# Patient Record
Sex: Male | Born: 1965 | Race: White | Hispanic: No | Marital: Single | Smoking: Former smoker
Health system: Southern US, Community
[De-identification: ages and names within clinical notes are randomized; demographics above are authoritative.]

## PROBLEM LIST (undated history)

## (undated) DIAGNOSIS — I10 Essential (primary) hypertension: Secondary | ICD-10-CM

## (undated) DIAGNOSIS — E039 Hypothyroidism, unspecified: Secondary | ICD-10-CM

## (undated) HISTORY — PX: ESOPHAGOGASTRODUODENOSCOPY: SHX1529

---

## 2011-12-13 ENCOUNTER — Ambulatory Visit: Payer: Self-pay | Admitting: Internal Medicine

## 2011-12-31 ENCOUNTER — Ambulatory Visit: Payer: Self-pay | Admitting: Gastroenterology

## 2015-02-10 ENCOUNTER — Encounter (INDEPENDENT_AMBULATORY_CARE_PROVIDER_SITE_OTHER): Payer: Self-pay | Admitting: *Deleted

## 2015-02-20 ENCOUNTER — Encounter (INDEPENDENT_AMBULATORY_CARE_PROVIDER_SITE_OTHER): Payer: Self-pay | Admitting: *Deleted

## 2015-02-20 ENCOUNTER — Other Ambulatory Visit (INDEPENDENT_AMBULATORY_CARE_PROVIDER_SITE_OTHER): Payer: Self-pay | Admitting: *Deleted

## 2015-02-20 DIAGNOSIS — Z1211 Encounter for screening for malignant neoplasm of colon: Secondary | ICD-10-CM

## 2015-03-24 ENCOUNTER — Telehealth (INDEPENDENT_AMBULATORY_CARE_PROVIDER_SITE_OTHER): Payer: Self-pay | Admitting: *Deleted

## 2015-03-24 DIAGNOSIS — Z1211 Encounter for screening for malignant neoplasm of colon: Secondary | ICD-10-CM

## 2015-03-24 NOTE — Telephone Encounter (Signed)
Patient needs trilyte 

## 2015-03-31 MED ORDER — PEG 3350-KCL-NA BICARB-NACL 420 G PO SOLR: 4000.0000 mL | Freq: Once | ORAL | Status: DC

## 2015-04-14 ENCOUNTER — Telehealth (INDEPENDENT_AMBULATORY_CARE_PROVIDER_SITE_OTHER): Payer: Self-pay | Admitting: *Deleted

## 2015-04-14 NOTE — Telephone Encounter (Signed)
Referring MD/PCP: dondiego   Procedure: tcs  Reason/Indication:  screening  Has patient had this procedure before?  no  If so, when, by whom and where?    Is there a family history of colon cancer?  no  Who?  What age when diagnosed?    Is patient diabetic?   no      Does patient have prosthetic heart valve or mechanical valve?  no  Do you have a pacemaker?  no  Has patient ever had endocarditis? no  Has patient had joint replacement within last 12 months?  no  Does patient tend to be constipated or take laxatives? no  Does patient have a history of alcohol/drug use?  no  Is patient on Coumadin, Plavix and/or Aspirin? no  Medications: synthroid 125 mcg daily, amlodipine 5 mg daily, lactulose 3 times a week, multi vit daily  Allergies: nkda  Medication Adjustment:   Procedure date & time: 05/07/15 at 930

## 2015-04-15 NOTE — Telephone Encounter (Signed)
agree

## 2015-05-07 ENCOUNTER — Encounter (HOSPITAL_COMMUNITY): Payer: Self-pay | Admitting: *Deleted

## 2015-05-07 ENCOUNTER — Encounter (HOSPITAL_COMMUNITY)
Admission: RE | Disposition: A | Discharge status: Home / Self Care | Payer: Self-pay | Source: Ambulatory Visit | Attending: Internal Medicine

## 2015-05-07 ENCOUNTER — Ambulatory Visit (HOSPITAL_COMMUNITY)
Admission: RE | Admit: 2015-05-07 | Discharge: 2015-05-07 | Disposition: A | Discharge status: Home / Self Care | Source: Ambulatory Visit | Attending: Internal Medicine | Admitting: Internal Medicine

## 2015-05-07 DIAGNOSIS — Z79899 Other long term (current) drug therapy: Secondary | ICD-10-CM | POA: Diagnosis not present

## 2015-05-07 DIAGNOSIS — I10 Essential (primary) hypertension: Secondary | ICD-10-CM | POA: Diagnosis not present

## 2015-05-07 DIAGNOSIS — K648 Other hemorrhoids: Secondary | ICD-10-CM

## 2015-05-07 DIAGNOSIS — Q438 Other specified congenital malformations of intestine: Secondary | ICD-10-CM | POA: Diagnosis not present

## 2015-05-07 DIAGNOSIS — E039 Hypothyroidism, unspecified: Secondary | ICD-10-CM | POA: Insufficient documentation

## 2015-05-07 DIAGNOSIS — Z1211 Encounter for screening for malignant neoplasm of colon: Secondary | ICD-10-CM

## 2015-05-07 DIAGNOSIS — F17200 Nicotine dependence, unspecified, uncomplicated: Secondary | ICD-10-CM | POA: Insufficient documentation

## 2015-05-07 DIAGNOSIS — K5730 Diverticulosis of large intestine without perforation or abscess without bleeding: Secondary | ICD-10-CM | POA: Insufficient documentation

## 2015-05-07 HISTORY — PX: COLONOSCOPY: SHX5424

## 2015-05-07 HISTORY — DX: Hypothyroidism, unspecified: E03.9

## 2015-05-07 HISTORY — DX: Essential (primary) hypertension: I10

## 2015-05-07 SURGERY — COLONOSCOPY
Anesthesia: Moderate Sedation

## 2015-05-07 MED ORDER — SODIUM CHLORIDE 0.9 % IV SOLN
INTRAVENOUS | Status: DC
  Administered 2015-05-07: 09:00:00 via INTRAVENOUS

## 2015-05-07 MED ORDER — MIDAZOLAM HCL 5 MG/5ML IJ SOLN
INTRAMUSCULAR | Status: AC
  Filled 2015-05-07: qty 10

## 2015-05-07 MED ORDER — STERILE WATER FOR IRRIGATION IR SOLN
Status: DC | PRN
  Administered 2015-05-07: 09:00:00

## 2015-05-07 MED ORDER — MIDAZOLAM HCL 5 MG/5ML IJ SOLN
INTRAMUSCULAR | Status: DC | PRN
  Administered 2015-05-07 (×7): 2 mg via INTRAVENOUS

## 2015-05-07 MED ORDER — MEPERIDINE HCL 50 MG/ML IJ SOLN
INTRAMUSCULAR | Status: DC | PRN
  Administered 2015-05-07 (×2): 25 mg via INTRAVENOUS

## 2015-05-07 MED ORDER — MEPERIDINE HCL 50 MG/ML IJ SOLN
INTRAMUSCULAR | Status: AC
  Filled 2015-05-07: qty 1

## 2015-05-07 MED ORDER — MIDAZOLAM HCL 5 MG/5ML IJ SOLN
INTRAMUSCULAR | Status: DC
Start: 2015-05-07 — End: 2015-05-07
  Filled 2015-05-07: qty 5

## 2015-05-07 NOTE — H&P (Signed)
Corey GermanyDavid Fernandez is an 49 y.o. male.   Chief Complaint: Patient is here for colonoscopy.Marland Kitchen. HPI: Patient is 49 year old Caucasian male who is here for screening colonoscopy. He denies abdominal pain change in bowel habits or rectal bleeding. Family history is negative for CRC.  Past Medical History  Diagnosis Date  . Hypertension   . Hypothyroidism       history of alcoholic liver disease for which he was [facility] at [facility] in 2012. No further details available. He is still on lactulose. He did undergo EGD and no varices were found.  Past Surgical History  Procedure Laterality Date  . Esophagogastroduodenoscopy      History reviewed. No pertinent family history. Social History:  reports that he has quit smoking. His smoking use included Cigarettes. He has a 20 pack-year smoking history. He does not have any smokeless tobacco history on file. He reports that he does not drink alcohol or use illicit drugs.  Allergies: No Known Allergies  Medications Prior to Admission  Medication Sig Dispense Refill  . amLODipine (NORVASC) 5 MG tablet Take 5 mg by mouth daily.    Marland Kitchen. lactulose (CHRONULAC) 10 GM/15ML solution Take 10 g by mouth daily.    Marland Kitchen. levothyroxine (SYNTHROID, LEVOTHROID) 125 MCG tablet Take 125 mcg by mouth daily before breakfast.    . Multiple Vitamins-Minerals (MULTIVITAMINS THER. W/MINERALS) TABS tablet Take 1 tablet by mouth daily.    . polyethylene glycol-electrolytes (NULYTELY/GOLYTELY) 420 G solution Take 4,000 mLs by mouth once. 4000 mL 0    No results found for this or any previous visit (from the past 48 hour(s)). No results found.  ROS  Blood pressure 123/81, pulse 64, temperature 98.3 F (36.8 C), temperature source Oral, resp. rate 14, height 5\' 9"  (1.753 m), weight 164 lb (74.39 kg), SpO2 100 %. Physical Exam  Constitutional: He appears well-developed and well-nourished.  HENT:  Mouth/Throat: Oropharynx is clear and moist.  Eyes: Conjunctivae are normal. No  scleral icterus.  Neck: No thyromegaly present.  Cardiovascular: Normal rate, regular rhythm and normal heart sounds.   No murmur heard. Respiratory: Effort normal and breath sounds normal.  GI: Soft. He exhibits no distension and no mass. There is no tenderness.  Musculoskeletal: He exhibits no edema.  Lymphadenopathy:    He has no cervical adenopathy.  Neurological: He is alert.  Skin: Skin is warm and dry.     Assessment/Plan Average risk screening colonoscopy.   Corey Fernandez 05/07/2015, 9:06 AM

## 2015-05-07 NOTE — Discharge Instructions (Signed)
Resume usual medications and diet. ° No driving for 24 hours. ° Next screening exam in 10 years. ° °Colonoscopy, Care After °Refer to this sheet in the next few weeks. These instructions provide you with information on caring for yourself after your procedure. Your health care provider may also give you more specific instructions. Your treatment has been planned according to current medical practices, but problems sometimes occur. Call your health care provider if you have any problems or questions after your procedure. °WHAT TO EXPECT AFTER THE PROCEDURE  °After your procedure, it is typical to have the following: °· A small amount of blood in your stool. °· Moderate amounts of gas and mild abdominal cramping or bloating. °HOME CARE INSTRUCTIONS °· Do not drive, operate machinery, or sign important documents for 24 hours. °· You may shower and resume your regular physical activities, but move at a slower pace for the first 24 hours. °· Take frequent rest periods for the first 24 hours. °· Walk around or put a warm pack on your abdomen to help reduce abdominal cramping and bloating. °· Drink enough fluids to keep your urine clear or pale yellow. °· You may resume your normal diet as instructed by your health care provider. Avoid heavy or fried foods that are hard to digest. °· Avoid drinking alcohol for 24 hours or as instructed by your health care provider. °· Only take over-the-counter or prescription medicines as directed by your health care provider. °· If a tissue sample (biopsy) was taken during your procedure: °¨ Do not take aspirin or blood thinners for 7 days, or as instructed by your health care provider. °¨ Do not drink alcohol for 7 days, or as instructed by your health care provider. °¨ Eat soft foods for the first 24 hours. °SEEK MEDICAL CARE IF: °You have persistent spotting of blood in your stool 2-3 days after the procedure. °SEEK IMMEDIATE MEDICAL CARE IF: °· You have more than a small spotting of  blood in your stool. °· You pass large blood clots in your stool. °· Your abdomen is swollen (distended). °· You have nausea or vomiting. °· You have a fever. °· You have increasing abdominal pain that is not relieved with medicine. °  °This information is not intended to replace advice given to you by your health care provider. Make sure you discuss any questions you have with your health care provider. °  °Document Released: 12/23/2003 Document Revised: 02/28/2013 Document Reviewed: 01/15/2013 °Elsevier Interactive Patient Education ©2016 [employer] ° °

## 2015-05-07 NOTE — Op Note (Signed)
COLONOSCOPY PROCEDURE REPORT  PATIENT:  Corey Fernandez  MR#:  [id number] Birthdate:  30-Jan-1966, [age] y.o., male Endoscopist:  Dr. Malissa HippoNajeeb U. Nyzaiah Kai, MD Referred By:  Dr. Dorie Rankichard M Don Diego, MD  Procedure Date: 05/07/2015  Procedure:   Colonoscopy  Indications:  Patient is 49 year old Caucasian male who is undergoing average risk screening colonoscopy.  Informed Consent:  The procedure and risks were reviewed with the patient and informed consent was obtained.  Medications:  Demerol 50 mg IV Versed 14 mg IV  Description of procedure:  After a digital rectal exam was performed, that colonoscope was advanced from the anus through the rectum and colon to the area of the cecum, ileocecal valve and appendiceal orifice. The cecum was deeply intubated. These structures were well-seen and photographed for the record. From the level of the cecum and ileocecal valve, the scope was slowly and cautiously withdrawn. The mucosal surfaces were carefully surveyed utilizing scope tip to flexion to facilitate fold flattening as needed. The scope was pulled down into the rectum where a thorough exam including retroflexion was performed. Scope was changed during the procedure because of malfunction.  Findings:  Prep excellent. Redundant colon with normal mucosa throughout. Single small diverticulum at sigmoid colon. Normal rectal mucosa. Hemorrhoids noted above and below the dentate line.   Therapeutic/Diagnostic Maneuvers Performed:   None  Complications:  None  EBL: None  Cecal Withdrawal Time:  6 minutes  Impression:  Examination performed to cecum. Redundant colon with single diverticulum at sigmoid colon. Internal and external hemorrhoids. No evidence of colonic polyps.  Recommendations:  Standard instructions given. Next screening exam in 10 years.  [name] U  05/07/2015 10:07 AM  CC: Dr. Isabella StallingNDIEGO,[name] M, MD & Dr. Bonnetta BarryNo ref. provider found

## 2015-05-09 ENCOUNTER — Encounter (HOSPITAL_COMMUNITY): Payer: Self-pay | Admitting: Internal Medicine

## 2019-08-17 ENCOUNTER — Emergency Department (HOSPITAL_COMMUNITY)
Admission: EM | Admit: 2019-08-17 | Discharge: 2019-08-17 | Disposition: A | Discharge status: Home / Self Care | Attending: Emergency Medicine | Admitting: Emergency Medicine

## 2019-08-17 ENCOUNTER — Other Ambulatory Visit: Payer: Self-pay

## 2019-08-17 ENCOUNTER — Emergency Department (HOSPITAL_COMMUNITY)

## 2019-08-17 DIAGNOSIS — Z87891 Personal history of nicotine dependence: Secondary | ICD-10-CM | POA: Diagnosis not present

## 2019-08-17 DIAGNOSIS — Z79899 Other long term (current) drug therapy: Secondary | ICD-10-CM | POA: Diagnosis not present

## 2019-08-17 DIAGNOSIS — E039 Hypothyroidism, unspecified: Secondary | ICD-10-CM | POA: Diagnosis not present

## 2019-08-17 DIAGNOSIS — R000 Tachycardia, unspecified: Secondary | ICD-10-CM | POA: Insufficient documentation

## 2019-08-17 LAB — CBC
HCT: 34.4 % — ABNORMAL LOW (ref 39.0–52.0)
Hemoglobin: 12 g/dL — ABNORMAL LOW (ref 13.0–17.0)
MCH: 34.3 pg — ABNORMAL HIGH (ref 26.0–34.0)
MCHC: 34.9 g/dL (ref 30.0–36.0)
MCV: 98.3 fL (ref 80.0–100.0)
Platelets: 113 10*3/uL — ABNORMAL LOW (ref 150–400)
RBC: 3.5 MIL/uL — ABNORMAL LOW (ref 4.22–5.81)
RDW: 13 % (ref 11.5–15.5)
WBC: 5.4 10*3/uL (ref 4.0–10.5)
nRBC: 0 % (ref 0.0–0.2)

## 2019-08-17 LAB — BASIC METABOLIC PANEL
Anion gap: 18 — ABNORMAL HIGH (ref 5–15)
BUN: <5 mg/dL — ABNORMAL LOW (ref 6–20)
CO2: 23 mmol/L (ref 22–32)
Calcium: 9.2 mg/dL (ref 8.9–10.3)
Chloride: 92 mmol/L — ABNORMAL LOW (ref 98–111)
Creatinine, Ser: 0.82 mg/dL (ref 0.61–1.24)
GFR calc Af Amer: >60 mL/min (ref >60)
GFR calc non Af Amer: >60 mL/min (ref >60)
Glucose, Bld: 109 mg/dL — ABNORMAL HIGH (ref 70–99)
Potassium: 3.3 mmol/L — ABNORMAL LOW (ref 3.5–5.1)
Sodium: 133 mmol/L — ABNORMAL LOW (ref 135–145)

## 2019-08-17 LAB — D-DIMER, QUANTITATIVE (NOT AT ARMC): D-Dimer, Quant: <0.27 ug/mL-FEU (ref 0.00–0.50)

## 2019-08-17 MED ORDER — IOHEXOL 350 MG/ML SOLN
80.0000 mL | Freq: Once | INTRAVENOUS | Status: AC | PRN
  Administered 2019-08-17: 80 mL via INTRAVENOUS

## 2019-08-17 MED ORDER — SODIUM CHLORIDE 0.9% FLUSH
3.0000 mL | Freq: Once | INTRAVENOUS | Status: AC
  Administered 2019-08-17: 3 mL via INTRAVENOUS

## 2019-08-17 NOTE — ED Provider Notes (Signed)
[place] EMERGENCY DEPARTMENT Provider Note   CSN: [id number] Arrival date & time: 08/17/19  1024     History Chief Complaint  Patient presents with  . Tachycardia    Corey Fernandez is a 54 y.o. male history of alcoholic cirrhosis of the liver, presenting to emergency department with report of tachycardia, palpitations, positive D-dimer test.  Patient reports has been noting some palpitations for the past few weeks.  He went to [place] (?)  Where he had blood work done including a D-dimer test.  He reports he was called today and told his D-dimer test was positive he should go to the emergency department as there is a concern for possible blood clot.  He has never had a blood clot before.  He denies any pain or swelling in either calf or lower extremity.  Denies any chest pain or shortness of breath.  His heart rate typically runs in the 80s, but he has noticed it is been over 100 recently.  No lightheadedness.  He does not drink alcohol currently.   HPI     Past Medical History:  Diagnosis Date  . Hypertension   . Hypothyroidism     There are no problems to display for this patient.   Past Surgical History:  Procedure Laterality Date  . COLONOSCOPY N/A 05/07/2015   Procedure: COLONOSCOPY;  Surgeon: Rogene Houston, MD;  Location: [facility];  Service: Endoscopy;  Laterality: N/A;  925  . ESOPHAGOGASTRODUODENOSCOPY         No family history on file.  Social History   Tobacco Use  . Smoking status: Former Smoker    Packs/day: 1.00    Years: 20.00    Pack years: 20.00    Types: Cigarettes  Substance Use Topics  . Alcohol use: No  . Drug use: No    Home Medications Prior to Admission medications   Medication Sig Start Date End Date Taking? Authorizing Provider  amLODipine (NORVASC) 5 MG tablet Take 5 mg by mouth daily.    [provider]  lactulose (CHRONULAC) 10 GM/15ML solution Take 10 g by mouth daily.     [provider]  levothyroxine (SYNTHROID, LEVOTHROID) 125 MCG tablet Take 125 mcg by mouth daily before breakfast.    [provider]  Multiple Vitamins-Minerals (MULTIVITAMINS THER. W/MINERALS) TABS tablet Take 1 tablet by mouth daily.    [provider]    Allergies    Patient has no known allergies.  Review of Systems   Review of Systems  Constitutional: Negative for chills and fever.  Eyes: Negative for photophobia and visual disturbance.  Respiratory: Negative for cough and shortness of breath.   Cardiovascular: Positive for palpitations. Negative for chest pain and leg swelling.  Gastrointestinal: Negative for abdominal pain, nausea and vomiting.  Genitourinary: Negative for dysuria and hematuria.  Musculoskeletal: Negative for arthralgias and back pain.  Skin: Negative for color change and rash.  Neurological: Negative for syncope, light-headedness and headaches.  Psychiatric/Behavioral: Negative for agitation and confusion.  All other systems reviewed and are negative.   Physical Exam Updated Vital Signs BP 121/86   Pulse (!) 110   Temp 99.5 F (37.5 C) (Oral)   Resp 18   Ht 5\' 9"  (1.753 m)   Wt 59 kg   SpO2 97%   BMI 19.20 kg/m   Physical Exam Vitals and nursing note reviewed.  Constitutional:      Appearance: He is well-developed.  HENT:  Head: Normocephalic and atraumatic.  Eyes:     Conjunctiva/sclera: Conjunctivae normal.  Cardiovascular:     Rate and Rhythm: Regular rhythm. Tachycardia present.     Heart sounds: No murmur.     Comments: Hr 100-110 bpm Pulmonary:     Effort: Pulmonary effort is normal. No respiratory distress.     Breath sounds: Normal breath sounds.  Abdominal:     Palpations: Abdomen is soft.     Tenderness: There is no abdominal tenderness.  Musculoskeletal:        General: No swelling or tenderness.     Cervical back: Neck supple.     Right lower leg: No edema.     Left lower leg: No edema.    Skin:    General: Skin is warm and dry.  Neurological:     General: No focal deficit present.     Mental Status: He is alert and oriented to person, place, and time.  Psychiatric:        Mood and Affect: Mood normal.        Behavior: Behavior normal.     ED Results / Procedures / Treatments   Labs (all labs ordered are listed, but only abnormal results are displayed) Labs Reviewed  BASIC METABOLIC PANEL - Abnormal; Notable for the following components:      Result Value   Sodium 133 (*)    Potassium 3.3 (*)    Chloride 92 (*)    Glucose, Bld 109 (*)    BUN <5 (*)    Anion gap 18 (*)    All other components within normal limits  CBC - Abnormal; Notable for the following components:   RBC 3.50 (*)    Hemoglobin 12.0 (*)    HCT 34.4 (*)    MCH 34.3 (*)    Platelets 113 (*)    All other components within normal limits  D-DIMER, QUANTITATIVE (NOT AT [facility])    EKG EKG Interpretation  Date/Time:  Friday August 17 2019 10:24:22 EDT Ventricular Rate:  116 PR Interval:  112 QRS Duration: 70 QT Interval:  346 QTC Calculation: 480 R Axis:   -8 Text Interpretation: Sinus tachycardia with Premature ventricular complexes or Fusion complexes Otherwise normal ECG No STEMI Confirmed by Alvester Chou [phone]) on 08/17/2019 11:40:43 AM   Radiology DG Chest 2 View  Result Date: 08/17/2019 CLINICAL DATA:  Tachycardia, positive D dimer, history of cirrhosis EXAM: CHEST - 2 VIEW COMPARISON:  None. FINDINGS: The heart size and mediastinal contours are within normal limits. Both lungs are clear. The visualized skeletal structures are unremarkable. IMPRESSION: No active cardiopulmonary disease. Electronically Signed   By: Sharlet Salina M.D.   On: 08/17/2019 10:46   CT Angio Chest PE W and/or Wo Contrast  Result Date: 08/17/2019 CLINICAL DATA:  Tachycardia, positive D-dimer. Shortness of breath. EXAM: CT ANGIOGRAPHY CHEST WITH CONTRAST TECHNIQUE: Multidetector CT imaging of the chest was  performed using the standard protocol during bolus administration of intravenous contrast. Multiplanar CT image reconstructions and MIPs were obtained to evaluate the vascular anatomy. CONTRAST:  38mL OMNIPAQUE IOHEXOL 350 MG/ML SOLN COMPARISON:  Chest x-ray March 11/11/2019 FINDINGS: Cardiovascular: Satisfactory opacification of the pulmonary arteries to the segmental level. No evidence of pulmonary embolism. Normal heart size. No pericardial effusion. Mediastinum/Nodes: No enlarged mediastinal, hilar, or axillary lymph nodes. Thyroid gland, trachea, and esophagus demonstrate no significant findings. Lungs/Pleura: Lungs are clear. No pleural effusion or pneumothorax. Upper Abdomen: Diffuse low density of liver is identified without focal  liver lesion. The other visualized upper abdominal structures are normal. Musculoskeletal: Mild degenerative joint changes of the spine are noted. Review of the MIP images confirms the above findings. IMPRESSION: 1. No pulmonary embolus. No acute abnormality identified in the chest. 2. Fatty infiltration of liver. Electronically Signed   By: Sherian Rein M.D.   On: 08/17/2019 13:13    Procedures Procedures (including critical care time)  Medications Ordered in ED Medications  sodium chloride flush (NS) 0.9 % injection 3 mL (3 mLs Intravenous Given 08/17/19 1112)  iohexol (OMNIPAQUE) 350 MG/ML injection 80 mL (80 mLs Intravenous Contrast Given 08/17/19 1301)    ED Course  I have reviewed the triage vital signs and the nursing notes.  Pertinent labs & imaging results that were available during my care of the patient were reviewed by me and considered in my medical decision making (see chart for details).  54 year old male presented emerge department positive outpatient D-dimer test.  Reporting tachycardia and palpitations with past several days or even weeks.  No lightheadedness shortness of breath.  His vital signs are stable here aside from mild tachycardia.  No  hypotension or significant hypoxia.  We'll check hgb level, creatinine, ddimer here to confirm (we don't have his results in our system).  If positive we can proceed with CT PE.  Doubtful of ACS.  Does not appear to be a fib on ecg.  Clinical Course as of Aug 16 1909  Fri Aug 17, 2019  1214 Ddimer test negative.  On re-examination patient continues to insist his palpitations and tachycardia is new.  My suspicion remains high enough to warrant a CT scan.   [MT]    Clinical Course User Index [MT] Grettell Ransdell, Kermit Balo, MD   Final Clinical Impression(s) / ED Diagnoses Final diagnoses:  Tachycardia    Rx / DC Orders ED Discharge Orders    None       Terald Sleeper, MD 08/17/19 1911

## 2019-08-17 NOTE — Discharge Instructions (Addendum)
There was no blood clot seen in your lungs on the CT scan of your lungs.  Your Ddimer blood test was NEGATIVE in the ER today.

## 2019-08-17 NOTE — ED Triage Notes (Signed)
C/o tachycardia and reported tested + d. Dimer, Patient endorsed recent hospitalization d/t alcohol cirrhosis.

## 2019-10-14 ENCOUNTER — Emergency Department (HOSPITAL_COMMUNITY)

## 2019-10-14 ENCOUNTER — Inpatient Hospital Stay (HOSPITAL_COMMUNITY)
Admission: EM | Admit: 2019-10-14 | Discharge: 2019-10-19 | DRG: 432 | Disposition: A | Discharge status: Hospice | Attending: Family Medicine | Admitting: Family Medicine

## 2019-10-14 ENCOUNTER — Other Ambulatory Visit: Payer: Self-pay

## 2019-10-14 DIAGNOSIS — E878 Other disorders of electrolyte and fluid balance, not elsewhere classified: Secondary | ICD-10-CM | POA: Diagnosis present

## 2019-10-14 DIAGNOSIS — K7460 Unspecified cirrhosis of liver: Secondary | ICD-10-CM

## 2019-10-14 DIAGNOSIS — Z66 Do not resuscitate: Secondary | ICD-10-CM | POA: Diagnosis present

## 2019-10-14 DIAGNOSIS — N179 Acute kidney failure, unspecified: Secondary | ICD-10-CM

## 2019-10-14 DIAGNOSIS — K8000 Calculus of gallbladder with acute cholecystitis without obstruction: Secondary | ICD-10-CM | POA: Diagnosis not present

## 2019-10-14 DIAGNOSIS — E43 Unspecified severe protein-calorie malnutrition: Secondary | ICD-10-CM | POA: Diagnosis not present

## 2019-10-14 DIAGNOSIS — Z87891 Personal history of nicotine dependence: Secondary | ICD-10-CM

## 2019-10-14 DIAGNOSIS — Z20822 Contact with and (suspected) exposure to covid-19: Secondary | ICD-10-CM | POA: Diagnosis not present

## 2019-10-14 DIAGNOSIS — Z515 Encounter for palliative care: Secondary | ICD-10-CM | POA: Diagnosis not present

## 2019-10-14 DIAGNOSIS — D696 Thrombocytopenia, unspecified: Secondary | ICD-10-CM | POA: Diagnosis present

## 2019-10-14 DIAGNOSIS — K7011 Alcoholic hepatitis with ascites: Secondary | ICD-10-CM | POA: Diagnosis present

## 2019-10-14 DIAGNOSIS — Z0189 Encounter for other specified special examinations: Secondary | ICD-10-CM

## 2019-10-14 DIAGNOSIS — Z79890 Hormone replacement therapy (postmenopausal): Secondary | ICD-10-CM

## 2019-10-14 DIAGNOSIS — D689 Coagulation defect, unspecified: Secondary | ICD-10-CM | POA: Diagnosis present

## 2019-10-14 DIAGNOSIS — E039 Hypothyroidism, unspecified: Secondary | ICD-10-CM | POA: Diagnosis present

## 2019-10-14 DIAGNOSIS — E876 Hypokalemia: Secondary | ICD-10-CM

## 2019-10-14 DIAGNOSIS — Z681 Body mass index (BMI) 19 or less, adult: Secondary | ICD-10-CM

## 2019-10-14 DIAGNOSIS — I10 Essential (primary) hypertension: Secondary | ICD-10-CM | POA: Diagnosis present

## 2019-10-14 DIAGNOSIS — K7031 Alcoholic cirrhosis of liver with ascites: Principal | ICD-10-CM | POA: Diagnosis present

## 2019-10-14 DIAGNOSIS — Z79899 Other long term (current) drug therapy: Secondary | ICD-10-CM

## 2019-10-14 DIAGNOSIS — Z9114 Patient's other noncompliance with medication regimen: Secondary | ICD-10-CM | POA: Diagnosis not present

## 2019-10-14 DIAGNOSIS — K7290 Hepatic failure, unspecified without coma: Secondary | ICD-10-CM | POA: Diagnosis present

## 2019-10-14 DIAGNOSIS — F1010 Alcohol abuse, uncomplicated: Secondary | ICD-10-CM | POA: Diagnosis present

## 2019-10-14 DIAGNOSIS — E871 Hypo-osmolality and hyponatremia: Secondary | ICD-10-CM | POA: Diagnosis present

## 2019-10-14 DIAGNOSIS — K7682 Hepatic encephalopathy: Secondary | ICD-10-CM

## 2019-10-14 DIAGNOSIS — K7040 Alcoholic hepatic failure without coma: Secondary | ICD-10-CM | POA: Diagnosis not present

## 2019-10-14 DIAGNOSIS — R531 Weakness: Secondary | ICD-10-CM

## 2019-10-14 DIAGNOSIS — D539 Nutritional anemia, unspecified: Secondary | ICD-10-CM | POA: Diagnosis present

## 2019-10-14 DIAGNOSIS — L8990 Pressure ulcer of unspecified site, unspecified stage: Secondary | ICD-10-CM | POA: Insufficient documentation

## 2019-10-14 DIAGNOSIS — G9341 Metabolic encephalopathy: Secondary | ICD-10-CM | POA: Diagnosis not present

## 2019-10-14 DIAGNOSIS — R17 Unspecified jaundice: Secondary | ICD-10-CM

## 2019-10-14 DIAGNOSIS — E872 Acidosis, unspecified: Secondary | ICD-10-CM

## 2019-10-14 DIAGNOSIS — B1920 Unspecified viral hepatitis C without hepatic coma: Secondary | ICD-10-CM | POA: Diagnosis present

## 2019-10-14 DIAGNOSIS — R5381 Other malaise: Secondary | ICD-10-CM | POA: Diagnosis present

## 2019-10-14 DIAGNOSIS — Z4659 Encounter for fitting and adjustment of other gastrointestinal appliance and device: Secondary | ICD-10-CM

## 2019-10-14 DIAGNOSIS — D509 Iron deficiency anemia, unspecified: Secondary | ICD-10-CM | POA: Diagnosis present

## 2019-10-14 DIAGNOSIS — Z7189 Other specified counseling: Secondary | ICD-10-CM

## 2019-10-14 LAB — CBC
HCT: 25.3 % — ABNORMAL LOW (ref 39.0–52.0)
Hemoglobin: 8.9 g/dL — ABNORMAL LOW (ref 13.0–17.0)
MCH: 36.6 pg — ABNORMAL HIGH (ref 26.0–34.0)
MCHC: 35.2 g/dL (ref 30.0–36.0)
MCV: 104.1 fL — ABNORMAL HIGH (ref 80.0–100.0)
Platelets: 158 10*3/uL (ref 150–400)
RBC: 2.43 MIL/uL — ABNORMAL LOW (ref 4.22–5.81)
RDW: 13.3 % (ref 11.5–15.5)
WBC: 11.6 10*3/uL — ABNORMAL HIGH (ref 4.0–10.5)
nRBC: 0.2 % (ref 0.0–0.2)

## 2019-10-14 LAB — COMPREHENSIVE METABOLIC PANEL
ALT: 76 U/L — ABNORMAL HIGH (ref 0–44)
AST: 102 U/L — ABNORMAL HIGH (ref 15–41)
Albumin: 1.8 g/dL — ABNORMAL LOW (ref 3.5–5.0)
Alkaline Phosphatase: 201 U/L — ABNORMAL HIGH (ref 38–126)
Anion gap: 21 — ABNORMAL HIGH (ref 5–15)
BUN: 8 mg/dL (ref 6–20)
CO2: 40 mmol/L — ABNORMAL HIGH (ref 22–32)
Calcium: 8.1 mg/dL — ABNORMAL LOW (ref 8.9–10.3)
Chloride: 69 mmol/L — ABNORMAL LOW (ref 98–111)
Creatinine, Ser: 1.62 mg/dL — ABNORMAL HIGH (ref 0.61–1.24)
GFR calc Af Amer: 55 mL/min — ABNORMAL LOW (ref >60)
GFR calc non Af Amer: 47 mL/min — ABNORMAL LOW (ref >60)
Glucose, Bld: 84 mg/dL (ref 70–99)
Potassium: <2 mmol/L — CL (ref 3.5–5.1)
Sodium: 130 mmol/L — ABNORMAL LOW (ref 135–145)
Total Bilirubin: 22.3 mg/dL (ref 0.3–1.2)
Total Protein: 6 g/dL — ABNORMAL LOW (ref 6.5–8.1)

## 2019-10-14 LAB — LIPASE, BLOOD: Lipase: 66 U/L — ABNORMAL HIGH (ref 11–51)

## 2019-10-14 MED ORDER — POTASSIUM CHLORIDE CRYS ER 20 MEQ PO TBCR
80.0000 meq | EXTENDED_RELEASE_TABLET | Freq: Once | ORAL | Status: AC
  Administered 2019-10-14: 80 meq via ORAL
  Filled 2019-10-14: qty 4

## 2019-10-14 MED ORDER — SODIUM CHLORIDE 0.9% FLUSH: 3.0000 mL | Freq: Once | INTRAVENOUS | Status: DC

## 2019-10-14 MED ORDER — POTASSIUM CHLORIDE 10 MEQ/100ML IV SOLN
10.0000 meq | INTRAVENOUS | Status: AC
  Administered 2019-10-14 – 2019-10-15 (×4): 10 meq via INTRAVENOUS
  Filled 2019-10-14 (×3): qty 100

## 2019-10-14 MED ORDER — MAGNESIUM SULFATE 2 GM/50ML IV SOLN
2.0000 g | Freq: Once | INTRAVENOUS | Status: AC
  Administered 2019-10-14: 2 g via INTRAVENOUS
  Filled 2019-10-14: qty 50

## 2019-10-14 NOTE — ED Triage Notes (Addendum)
Pt has hx of cirrhosis and hepatitis C per pt brother. Pt brother says that he has been unable to get in contact with the patient. Police went to home for welfare check. Pt has jaundiced, distended abdomen, not acting normal. Pt is jaundiced, slow to respond to answers in triage.

## 2019-10-14 NOTE — ED Notes (Signed)
CRITICAL VALUE STICKER  CRITICAL VALUE: K <2.0; Total Bili 22.3  RECEIVER (on-site recipient of call): [place] Ferrari RN  DATE & TIME NOTIFIED: 10/14/19 1105p  MESSENGER (representative from lab): Vince  PROVIDER NOTIFIED: Cyndia Skeeters. PA  TIME OF NOTIFICATION: 1102p  RESPONSE: see orders

## 2019-10-15 ENCOUNTER — Encounter (HOSPITAL_COMMUNITY): Payer: Self-pay | Admitting: Family Medicine

## 2019-10-15 ENCOUNTER — Inpatient Hospital Stay (HOSPITAL_COMMUNITY)

## 2019-10-15 DIAGNOSIS — K7040 Alcoholic hepatic failure without coma: Secondary | ICD-10-CM | POA: Diagnosis present

## 2019-10-15 DIAGNOSIS — E039 Hypothyroidism, unspecified: Secondary | ICD-10-CM | POA: Diagnosis present

## 2019-10-15 DIAGNOSIS — E43 Unspecified severe protein-calorie malnutrition: Secondary | ICD-10-CM | POA: Diagnosis present

## 2019-10-15 DIAGNOSIS — E872 Acidosis: Secondary | ICD-10-CM | POA: Diagnosis present

## 2019-10-15 DIAGNOSIS — Z681 Body mass index (BMI) 19 or less, adult: Secondary | ICD-10-CM | POA: Diagnosis not present

## 2019-10-15 DIAGNOSIS — I10 Essential (primary) hypertension: Secondary | ICD-10-CM | POA: Diagnosis present

## 2019-10-15 DIAGNOSIS — K7031 Alcoholic cirrhosis of liver with ascites: Principal | ICD-10-CM

## 2019-10-15 DIAGNOSIS — K8000 Calculus of gallbladder with acute cholecystitis without obstruction: Secondary | ICD-10-CM | POA: Diagnosis present

## 2019-10-15 DIAGNOSIS — E871 Hypo-osmolality and hyponatremia: Secondary | ICD-10-CM | POA: Diagnosis present

## 2019-10-15 DIAGNOSIS — E876 Hypokalemia: Secondary | ICD-10-CM | POA: Diagnosis present

## 2019-10-15 DIAGNOSIS — G9341 Metabolic encephalopathy: Secondary | ICD-10-CM | POA: Diagnosis present

## 2019-10-15 DIAGNOSIS — B1920 Unspecified viral hepatitis C without hepatic coma: Secondary | ICD-10-CM | POA: Diagnosis present

## 2019-10-15 DIAGNOSIS — K7290 Hepatic failure, unspecified without coma: Secondary | ICD-10-CM | POA: Diagnosis present

## 2019-10-15 DIAGNOSIS — Z66 Do not resuscitate: Secondary | ICD-10-CM | POA: Diagnosis present

## 2019-10-15 DIAGNOSIS — D689 Coagulation defect, unspecified: Secondary | ICD-10-CM

## 2019-10-15 DIAGNOSIS — D539 Nutritional anemia, unspecified: Secondary | ICD-10-CM | POA: Diagnosis present

## 2019-10-15 DIAGNOSIS — N179 Acute kidney failure, unspecified: Secondary | ICD-10-CM | POA: Diagnosis present

## 2019-10-15 DIAGNOSIS — Z9114 Patient's other noncompliance with medication regimen: Secondary | ICD-10-CM | POA: Diagnosis not present

## 2019-10-15 DIAGNOSIS — R17 Unspecified jaundice: Secondary | ICD-10-CM | POA: Diagnosis not present

## 2019-10-15 DIAGNOSIS — Z79890 Hormone replacement therapy (postmenopausal): Secondary | ICD-10-CM | POA: Diagnosis not present

## 2019-10-15 DIAGNOSIS — K7200 Acute and subacute hepatic failure without coma: Secondary | ICD-10-CM | POA: Diagnosis not present

## 2019-10-15 DIAGNOSIS — F1010 Alcohol abuse, uncomplicated: Secondary | ICD-10-CM | POA: Diagnosis present

## 2019-10-15 DIAGNOSIS — D696 Thrombocytopenia, unspecified: Secondary | ICD-10-CM | POA: Diagnosis present

## 2019-10-15 DIAGNOSIS — Z20822 Contact with and (suspected) exposure to covid-19: Secondary | ICD-10-CM | POA: Diagnosis present

## 2019-10-15 DIAGNOSIS — K7682 Hepatic encephalopathy: Secondary | ICD-10-CM | POA: Diagnosis present

## 2019-10-15 DIAGNOSIS — Z7189 Other specified counseling: Secondary | ICD-10-CM | POA: Diagnosis not present

## 2019-10-15 DIAGNOSIS — K7460 Unspecified cirrhosis of liver: Secondary | ICD-10-CM | POA: Diagnosis present

## 2019-10-15 DIAGNOSIS — Z515 Encounter for palliative care: Secondary | ICD-10-CM | POA: Diagnosis not present

## 2019-10-15 DIAGNOSIS — K7011 Alcoholic hepatitis with ascites: Secondary | ICD-10-CM | POA: Diagnosis present

## 2019-10-15 LAB — GLUCOSE, PLEURAL OR PERITONEAL FLUID: Glucose, Fluid: 80 mg/dL

## 2019-10-15 LAB — SALICYLATE LEVEL: Salicylate Lvl: 14.9 mg/dL (ref 7.0–30.0)

## 2019-10-15 LAB — COMPREHENSIVE METABOLIC PANEL
ALT: 65 U/L — ABNORMAL HIGH (ref 0–44)
AST: 270 U/L — ABNORMAL HIGH (ref 15–41)
Albumin: 1.5 g/dL — ABNORMAL LOW (ref 3.5–5.0)
Alkaline Phosphatase: 167 U/L — ABNORMAL HIGH (ref 38–126)
Anion gap: 19 — ABNORMAL HIGH (ref 5–15)
BUN: 7 mg/dL (ref 6–20)
CO2: 36 mmol/L — ABNORMAL HIGH (ref 22–32)
Calcium: 7.2 mg/dL — ABNORMAL LOW (ref 8.9–10.3)
Chloride: 75 mmol/L — ABNORMAL LOW (ref 98–111)
Creatinine, Ser: 1.41 mg/dL — ABNORMAL HIGH (ref 0.61–1.24)
GFR calc Af Amer: >60 mL/min (ref >60)
GFR calc non Af Amer: 56 mL/min — ABNORMAL LOW (ref >60)
Glucose, Bld: 78 mg/dL (ref 70–99)
Potassium: 2.3 mmol/L — CL (ref 3.5–5.1)
Sodium: 130 mmol/L — ABNORMAL LOW (ref 135–145)
Total Bilirubin: 19.4 mg/dL (ref 0.3–1.2)
Total Protein: 4.9 g/dL — ABNORMAL LOW (ref 6.5–8.1)

## 2019-10-15 LAB — CBC WITH DIFFERENTIAL/PLATELET
Abs Immature Granulocytes: 0.1 10*3/uL — ABNORMAL HIGH (ref 0.00–0.07)
Basophils Absolute: 0 10*3/uL (ref 0.0–0.1)
Basophils Relative: 0 %
Eosinophils Absolute: 0 10*3/uL (ref 0.0–0.5)
Eosinophils Relative: 0 %
HCT: 23.6 % — ABNORMAL LOW (ref 39.0–52.0)
Hemoglobin: 8.4 g/dL — ABNORMAL LOW (ref 13.0–17.0)
Immature Granulocytes: 1 %
Lymphocytes Relative: 10 %
Lymphs Abs: 0.9 10*3/uL (ref 0.7–4.0)
MCH: 37.3 pg — ABNORMAL HIGH (ref 26.0–34.0)
MCHC: 35.6 g/dL (ref 30.0–36.0)
MCV: 104.9 fL — ABNORMAL HIGH (ref 80.0–100.0)
Monocytes Absolute: 1.1 10*3/uL — ABNORMAL HIGH (ref 0.1–1.0)
Monocytes Relative: 12 %
Neutro Abs: 7 10*3/uL (ref 1.7–7.7)
Neutrophils Relative %: 77 %
Platelets: 125 10*3/uL — ABNORMAL LOW (ref 150–400)
RBC: 2.25 MIL/uL — ABNORMAL LOW (ref 4.22–5.81)
RDW: 13.6 % (ref 11.5–15.5)
WBC: 9.1 10*3/uL (ref 4.0–10.5)
nRBC: 0 % (ref 0.0–0.2)

## 2019-10-15 LAB — GRAM STAIN

## 2019-10-15 LAB — IRON AND TIBC
Iron: 102 ug/dL (ref 45–182)
Saturation Ratios: 113 % — ABNORMAL HIGH (ref 17.9–39.5)
TIBC: 91 ug/dL — ABNORMAL LOW (ref 250–450)

## 2019-10-15 LAB — PROTEIN, PLEURAL OR PERITONEAL FLUID: Total protein, fluid: <3 g/dL

## 2019-10-15 LAB — POTASSIUM: Potassium: 2.4 mmol/L — CL (ref 3.5–5.1)

## 2019-10-15 LAB — T4, FREE: Free T4: 1.83 ng/dL — ABNORMAL HIGH (ref 0.61–1.12)

## 2019-10-15 LAB — MAGNESIUM
Magnesium: 1.2 mg/dL — ABNORMAL LOW (ref 1.7–2.4)
Magnesium: 1.6 mg/dL — ABNORMAL LOW (ref 1.7–2.4)
Magnesium: 2.8 mg/dL — ABNORMAL HIGH (ref 1.7–2.4)

## 2019-10-15 LAB — BILIRUBIN, DIRECT: Bilirubin, Direct: 10.7 mg/dL — ABNORMAL HIGH (ref 0.0–0.2)

## 2019-10-15 LAB — LACTIC ACID, PLASMA
Lactic Acid, Venous: 5.3 mmol/L (ref 0.5–1.9)
Lactic Acid, Venous: 5.7 mmol/L (ref 0.5–1.9)

## 2019-10-15 LAB — RETICULOCYTES
Immature Retic Fract: 15.9 % (ref 2.3–15.9)
RBC.: 2.17 MIL/uL — ABNORMAL LOW (ref 4.22–5.81)
Retic Count, Absolute: 84.6 10*3/uL (ref 19.0–186.0)
Retic Ct Pct: 3.9 % — ABNORMAL HIGH (ref 0.4–3.1)

## 2019-10-15 LAB — AMYLASE, PLEURAL OR PERITONEAL FLUID: Amylase, Fluid: 32 U/L

## 2019-10-15 LAB — LACTATE DEHYDROGENASE, PLEURAL OR PERITONEAL FLUID: LD, Fluid: 65 U/L — ABNORMAL HIGH (ref 3–23)

## 2019-10-15 LAB — HEPATITIS PANEL, ACUTE
HCV Ab: NONREACTIVE
Hep A IgM: NONREACTIVE
Hep B C IgM: NONREACTIVE
Hepatitis B Surface Ag: NONREACTIVE

## 2019-10-15 LAB — TSH: TSH: 0.071 u[IU]/mL — ABNORMAL LOW (ref 0.350–4.500)

## 2019-10-15 LAB — MRSA PCR SCREENING: MRSA by PCR: NEGATIVE

## 2019-10-15 LAB — VITAMIN B12: Vitamin B-12: 1238 pg/mL — ABNORMAL HIGH (ref 180–914)

## 2019-10-15 LAB — PROTIME-INR
INR: 1.7 — ABNORMAL HIGH (ref 0.8–1.2)
Prothrombin Time: 19.1 seconds — ABNORMAL HIGH (ref 11.4–15.2)

## 2019-10-15 LAB — AMMONIA: Ammonia: 81 umol/L — ABNORMAL HIGH (ref 9–35)

## 2019-10-15 LAB — BODY FLUID CELL COUNT WITH DIFFERENTIAL
Lymphs, Fluid: 2 %
Monocyte-Macrophage-Serous Fluid: 90 % (ref 50–90)
Neutrophil Count, Fluid: 8 % (ref 0–25)
Total Nucleated Cell Count, Fluid: 28 cu mm (ref 0–1000)

## 2019-10-15 LAB — FERRITIN: Ferritin: 2394 ng/mL — ABNORMAL HIGH (ref 24–336)

## 2019-10-15 LAB — SARS CORONAVIRUS 2 BY RT PCR (HOSPITAL ORDER, PERFORMED IN ~~LOC~~ HOSPITAL LAB): SARS Coronavirus 2: NEGATIVE

## 2019-10-15 LAB — ACETAMINOPHEN LEVEL: Acetaminophen (Tylenol), Serum: <10 ug/mL — ABNORMAL LOW (ref 10–30)

## 2019-10-15 LAB — ALBUMIN, PLEURAL OR PERITONEAL FLUID: Albumin, Fluid: <1 g/dL

## 2019-10-15 LAB — PHOSPHORUS: Phosphorus: 3 mg/dL (ref 2.5–4.6)

## 2019-10-15 LAB — HIV ANTIBODY (ROUTINE TESTING W REFLEX): HIV Screen 4th Generation wRfx: NONREACTIVE

## 2019-10-15 LAB — FOLATE: Folate: 18 ng/mL (ref >5.9)

## 2019-10-15 LAB — APTT: aPTT: 41 seconds — ABNORMAL HIGH (ref 24–36)

## 2019-10-15 MED ORDER — SODIUM CHLORIDE 0.9% FLUSH
3.0000 mL | Freq: Two times a day (BID) | INTRAVENOUS | Status: DC
  Administered 2019-10-15 – 2019-10-18 (×8): 3 mL via INTRAVENOUS

## 2019-10-15 MED ORDER — ADULT MULTIVITAMIN W/MINERALS CH
1.0000 | ORAL_TABLET | Freq: Every day | ORAL | Status: DC
  Administered 2019-10-15: 1 via ORAL
  Filled 2019-10-15: qty 1

## 2019-10-15 MED ORDER — LORAZEPAM 2 MG/ML IJ SOLN: 0.0000 mg | Freq: Three times a day (TID) | INTRAMUSCULAR | Status: AC

## 2019-10-15 MED ORDER — LIDOCAINE HCL 1 % IJ SOLN
INTRAMUSCULAR | Status: AC
  Filled 2019-10-15: qty 20

## 2019-10-15 MED ORDER — POTASSIUM CHLORIDE IN NACL 20-0.9 MEQ/L-% IV SOLN
INTRAVENOUS | Status: AC
  Filled 2019-10-15: qty 1000

## 2019-10-15 MED ORDER — THIAMINE HCL 100 MG PO TABS
100.0000 mg | ORAL_TABLET | Freq: Every day | ORAL | Status: DC
  Administered 2019-10-15: 100 mg via ORAL
  Filled 2019-10-15: qty 1

## 2019-10-15 MED ORDER — ONDANSETRON HCL 4 MG PO TABS: 4.0000 mg | ORAL_TABLET | Freq: Four times a day (QID) | ORAL | Status: DC | PRN

## 2019-10-15 MED ORDER — ALBUMIN HUMAN 25 % IV SOLN
50.0000 g | Freq: Once | INTRAVENOUS | Status: DC | PRN
  Filled 2019-10-15: qty 200

## 2019-10-15 MED ORDER — ONDANSETRON HCL 4 MG/2ML IJ SOLN: 4.0000 mg | Freq: Four times a day (QID) | INTRAMUSCULAR | Status: DC | PRN

## 2019-10-15 MED ORDER — MAGNESIUM SULFATE 50 % IJ SOLN: 4.0000 g | Freq: Once | INTRAVENOUS | Status: DC

## 2019-10-15 MED ORDER — LEVOTHYROXINE SODIUM 100 MCG PO TABS: 100.0000 ug | ORAL_TABLET | Freq: Every day | ORAL | Status: DC

## 2019-10-15 MED ORDER — LORAZEPAM 2 MG/ML IJ SOLN
0.0000 mg | INTRAMUSCULAR | Status: AC
  Administered 2019-10-15: 2 mg via INTRAVENOUS
  Filled 2019-10-15: qty 1

## 2019-10-15 MED ORDER — POTASSIUM CHLORIDE 10 MEQ/100ML IV SOLN
10.0000 meq | INTRAVENOUS | Status: AC
  Administered 2019-10-15 – 2019-10-16 (×5): 10 meq via INTRAVENOUS
  Filled 2019-10-15 (×5): qty 100

## 2019-10-15 MED ORDER — CHLORHEXIDINE GLUCONATE CLOTH 2 % EX PADS
6.0000 | MEDICATED_PAD | Freq: Every day | CUTANEOUS | Status: DC
  Administered 2019-10-15 – 2019-10-19 (×5): 6 via TOPICAL

## 2019-10-15 MED ORDER — POTASSIUM CHLORIDE CRYS ER 20 MEQ PO TBCR
40.0000 meq | EXTENDED_RELEASE_TABLET | ORAL | Status: AC
  Administered 2019-10-15 (×2): 40 meq via ORAL
  Filled 2019-10-15 (×2): qty 2

## 2019-10-15 MED ORDER — LACTULOSE 10 GM/15ML PO SOLN
20.0000 g | Freq: Three times a day (TID) | ORAL | Status: DC
  Administered 2019-10-15 (×3): 20 g via ORAL
  Filled 2019-10-15 (×3): qty 30

## 2019-10-15 MED ORDER — PANTOPRAZOLE SODIUM 40 MG PO TBEC
40.0000 mg | DELAYED_RELEASE_TABLET | Freq: Every day | ORAL | Status: DC
  Administered 2019-10-15: 40 mg via ORAL
  Filled 2019-10-15 (×2): qty 1

## 2019-10-15 MED ORDER — LORAZEPAM 1 MG PO TABS
1.0000 mg | ORAL_TABLET | ORAL | Status: AC | PRN
  Filled 2019-10-15: qty 2

## 2019-10-15 MED ORDER — ALBUMIN HUMAN 25 % IV SOLN
25.0000 g | Freq: Once | INTRAVENOUS | Status: AC
  Administered 2019-10-15: 25 g via INTRAVENOUS
  Filled 2019-10-15: qty 100

## 2019-10-15 MED ORDER — LEVOTHYROXINE SODIUM 25 MCG PO TABS: 125.0000 ug | ORAL_TABLET | Freq: Every day | ORAL | Status: DC

## 2019-10-15 MED ORDER — LORAZEPAM 2 MG/ML IJ SOLN
1.0000 mg | INTRAMUSCULAR | Status: AC | PRN
  Administered 2019-10-15: 1 mg via INTRAVENOUS
  Administered 2019-10-16: 2 mg via INTRAVENOUS
  Filled 2019-10-15 (×2): qty 1

## 2019-10-15 MED ORDER — THIAMINE HCL 100 MG/ML IJ SOLN
100.0000 mg | Freq: Every day | INTRAMUSCULAR | Status: DC
  Administered 2019-10-16: 100 mg via INTRAVENOUS
  Filled 2019-10-15 (×2): qty 2

## 2019-10-15 MED ORDER — SODIUM CHLORIDE 0.9 % IV BOLUS
1000.0000 mL | Freq: Once | INTRAVENOUS | Status: AC
  Administered 2019-10-15: 1000 mL via INTRAVENOUS

## 2019-10-15 MED ORDER — ALBUMIN HUMAN 25 % IV SOLN
25.0000 g | Freq: Four times a day (QID) | INTRAVENOUS | Status: AC
  Administered 2019-10-15 – 2019-10-16 (×2): 25 g via INTRAVENOUS
  Filled 2019-10-15: qty 100

## 2019-10-15 MED ORDER — FOLIC ACID 1 MG PO TABS
1.0000 mg | ORAL_TABLET | Freq: Every day | ORAL | Status: DC
  Administered 2019-10-15: 1 mg via ORAL
  Filled 2019-10-15: qty 1

## 2019-10-15 MED ORDER — MAGNESIUM SULFATE 4 GM/100ML IV SOLN
4.0000 g | Freq: Once | INTRAVENOUS | Status: AC
  Administered 2019-10-15: 4 g via INTRAVENOUS
  Filled 2019-10-15: qty 100

## 2019-10-15 NOTE — ED Provider Notes (Signed)
[facility]-EMERGENCY DEPT Provider Note   CSN: [id number] Arrival date & time: 10/14/19  2124     History Chief Complaint  Patient presents with  . Altered Mental Status    Corey Fernandez is a 54 y.o. 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  Patient reports he has not been eating.  Patient reports his last Kayexalate dose was 3 days ago and his last bowel movement was several months ago.  Patient does answer orientation questions and yes/no questions but answers other questions are confused.  Brother provides most of the history.  Level 5 caveat for altered mental status.  Brother reports patient has a history of chronic Tylenol usage and alcohol usage which has caused his cirrhosis.  He reports that in January he was admitted to hospital in [facility] for treatment of his jaundice and liver cirrhosis.  He reports patient did well after several months and returned home in March.  Brother reports patient not jaundiced at that time.  No family is in the area.  Brother reports over the last week, patient has seemed more confused over the phone and 3 days ago he stopped answering the phone altogether.  Welfare check with the sheriff's department was initiated last night patient was found confused but oriented enough to answer ANO x4 questions.  He refused EMS transport at that time.  Brother drove from [place] today and brought patient immediately to the hospital.  Additionally, brother reports that it is clear patient has not been eating.  They were beer cans scattered around.  He does report there was Tylenol in the home.  No one lives with the patient.  Unknown if he has been taking his medications but brother is doubtful.     The history is provided by the patient and medical  records. No language interpreter was used.       Past Medical History:  Diagnosis Date  . Hypertension   . Hypothyroidism     Patient Active Problem List   Diagnosis Date Noted  . Acute hepatic encephalopathy 10/15/2019  . Decompensated hepatic cirrhosis (HCC) 10/15/2019  . Coagulopathy (HCC) 10/15/2019  . Hypothyroidism   . Hypokalemia   . Hypomagnesemia   . AKI (acute kidney injury) (HCC)   . Hyponatremia   . Macrocytic anemia     Past Surgical History:  Procedure Laterality Date  . COLONOSCOPY N/A 05/07/2015   Procedure: COLONOSCOPY;  Surgeon: Malissa Hippo, MD;  Location: [facility];  Service: Endoscopy;  Laterality: N/A;  925  . ESOPHAGOGASTRODUODENOSCOPY         History reviewed. No pertinent family history.  Social History   Tobacco Use  . Smoking status: Former Smoker    Packs/day: 1.00    Years: 20.00    Pack years: 20.00    Types: Cigarettes  Substance Use Topics  . Alcohol use: No  . Drug use: No    Home Medications Prior to Admission medications   Medication Sig Start Date End Date Taking? Authorizing Provider  amLODipine (NORVASC) 5 MG tablet Take 5 mg by mouth daily.   Yes [provider]  Cholecalciferol (VITAMIN D3) 1.25 MG (50000 UT) CAPS Take 1 capsule by mouth once a week. 08/01/19  Yes [provider]  folic acid (FOLVITE) 1 MG tablet Take 1 mg by mouth daily.  10/05/19  Yes [provider]  lactulose (CHRONULAC) 10 GM/15ML solution Take 10 g by mouth daily.   Yes [provider]  levothyroxine (SYNTHROID, LEVOTHROID) 125 MCG tablet Take 125 mcg by mouth daily before breakfast.   Yes [provider]  metoprolol succinate (TOPROL-XL) 25 MG 24 hr tablet Take 25 mg by mouth daily. 08/02/19  Yes [provider]  Multiple Vitamins-Minerals (MULTIVITAMINS THER. W/MINERALS) TABS tablet Take 1 tablet by mouth daily.   Yes [provider]  pantoprazole (PROTONIX) 40 MG tablet Take 40  mg by mouth daily. 10/05/19  Yes [provider]  spironolactone (ALDACTONE) 25 MG tablet Take 12.5 mg by mouth daily. 07/30/19  Yes [provider]    Allergies    Patient has no known allergies.  Review of Systems   Review of Systems  Unable to perform ROS: Mental status change  Skin: Positive for color change.  Psychiatric/Behavioral: Positive for confusion.    Physical Exam Updated Vital Signs BP 111/75   Pulse 95   Temp 97.9 F (36.6 C)   Resp (!) 22   SpO2 97%   Physical Exam Vitals and nursing note reviewed.  Constitutional:      General: He is not in acute distress.    Appearance: He is ill-appearing. He is not diaphoretic.  HENT:     Head: Normocephalic.     Mouth/Throat:     Mouth: Mucous membranes are dry.  Eyes:     General: Scleral icterus present.     Extraocular Movements: Extraocular movements intact.     Conjunctiva/sclera: Conjunctivae normal.  Cardiovascular:     Rate and Rhythm: Normal rate and regular rhythm.     Pulses: Normal pulses.          Radial pulses are 2+ on the right side and 2+ on the left side.  Pulmonary:     Effort: Tachypnea present. No accessory muscle usage, prolonged expiration, respiratory distress or retractions.     Breath sounds: Normal breath sounds. No stridor.     Comments: Equal chest rise. mild increased work of breathing; no wheezes or rhonchi.  No rales Abdominal:     General: There is distension.     Palpations: Abdomen is soft. There is fluid wave.     Tenderness: There is no abdominal tenderness. There is no guarding or rebound.  Musculoskeletal:     Cervical back: Normal range of motion.     Right lower leg: 2+ Pitting Edema present.     Left lower leg: 2+ Pitting Edema present.     Comments: Moves all extremities equally and without difficulty.  Skin:    General: Skin is warm and dry.     Capillary Refill: Capillary refill takes less than 2 seconds.     Coloration: Skin is jaundiced (  Severe).  Neurological:     Mental Status: He is alert.     GCS: GCS eye subscore is 4. GCS verbal subscore is 5. GCS motor subscore is 6.     Comments: Speech is clear and goal oriented.  Psychiatric:        Mood and Affect: Mood normal.     ED Results / Procedures / Treatments   Labs (all labs ordered are listed, but only abnormal results are displayed) Labs Reviewed  LIPASE, BLOOD - Abnormal; Notable for the following components:      Result Value   Lipase 66 (*)    All other components within normal limits  COMPREHENSIVE METABOLIC PANEL - Abnormal; Notable for the following components:   Sodium 130 (*)    Potassium <2.0 (*)    Chloride 69 (*)    CO2 40 (*)    Creatinine, Ser 1.62 (*)    Calcium 8.1 (*)    Total Protein 6.0 (*)    Albumin 1.8 (*)    AST 102 (*)    ALT 76 (*)    Alkaline Phosphatase 201 (*)    Total Bilirubin 22.3 (*)    GFR calc non Af Amer 47 (*)    GFR calc Af Amer 55 (*)    Anion gap 21 (*)    All other components within normal limits  CBC - Abnormal; Notable for the following components:   WBC 11.6 (*)    RBC 2.43 (*)    Hemoglobin 8.9 (*)    HCT 25.3 (*)    MCV 104.1 (*)    MCH 36.6 (*)    All other components within normal limits  AMMONIA - Abnormal; Notable for the following components:   Ammonia 81 (*)    All other components within normal limits  MAGNESIUM - Abnormal; Notable for the following components:   Magnesium 1.2 (*)    All other components within normal limits  PROTIME-INR - Abnormal; Notable for the following components:   Prothrombin Time 19.1 (*)    INR 1.7 (*)    All other components within normal limits  APTT - Abnormal; Notable for the following components:   aPTT 41 (*)    All other components within normal limits  LACTIC ACID, PLASMA - Abnormal; Notable for the following components:   Lactic Acid, Venous 5.7 (*)    All other components within normal limits  LACTIC ACID, PLASMA - Abnormal; Notable for the  following components:   Lactic Acid, Venous 5.3 (*)    All other components within normal limits  ACETAMINOPHEN LEVEL - Abnormal; Notable for the following components:   Acetaminophen (Tylenol), Serum <10 (*)    All other components within normal limits  BILIRUBIN, DIRECT - Abnormal; Notable for the following components:   Bilirubin, Direct 10.7 (*)    All other components within normal limits  VITAMIN B12 - Abnormal; Notable for the following components:   Vitamin B-12 1,238 (*)    All other components within normal limits  IRON AND TIBC - Abnormal; Notable for the following components:   TIBC 91 (*)    Saturation Ratios 113 (*)    All other components within normal limits  FERRITIN - Abnormal; Notable for the following components:   Ferritin 2,394 (*)    All other components within normal limits  RETICULOCYTES - Abnormal; Notable for the following components:   Retic Ct Pct 3.9 (*)    RBC. 2.17 (*)    All other components within normal limits  TSH - Abnormal; Notable for the following components:   TSH 0.071 (*)    All other components within normal limits  SARS CORONAVIRUS 2 BY RT PCR (HOSPITAL ORDER, PERFORMED IN [facility])  SALICYLATE LEVEL  FOLATE  URINALYSIS, ROUTINE W REFLEX MICROSCOPIC  HEPATITIS PANEL, ACUTE  SODIUM, URINE, RANDOM  CREATININE, URINE, RANDOM  COMPREHENSIVE METABOLIC PANEL  MAGNESIUM  PHOSPHORUS  CBC WITH DIFFERENTIAL/PLATELET  HIV ANTIBODY (ROUTINE TESTING W REFLEX)    EKG EKG Interpretation  Date/Time:  Sunday Oct 14 2019 22:01:30 EDT Ventricular Rate:  96 PR Interval:    QRS Duration: 89 QT Interval:  394  QTC Calculation: 498 R Axis:   -12 Text Interpretation: Sinus rhythm Borderline short PR interval Borderline prolonged QT interval QT slightly more prolonged Confirmed by Richardean Canal [id number]) on 10/14/2019 10:09:12 PM   Radiology CT Head Wo Contrast  Result Date: 10/15/2019 CLINICAL DATA:  Recurrent falls, liver  failure, cirrhosis and hepatitis C EXAM: CT HEAD WITHOUT CONTRAST TECHNIQUE: Contiguous axial images were obtained from the base of the skull through the vertex without intravenous contrast. COMPARISON:  None. FINDINGS: Brain: No evidence of acute infarction, hemorrhage, hydrocephalus, extra-axial collection or mass lesion/mass effect. Symmetric prominence of the ventricles, cisterns and sulci compatible with parenchymal volume loss. Patchy areas of white matter hypoattenuation are most compatible with chronic microvascular angiopathy. Vascular: Atherosclerotic calcification of the carotid siphons. No hyperdense vessel. Skull: No calvarial fracture or suspicious osseous lesion. No scalp swelling or hematoma. Sinuses/Orbits: Paranasal sinuses and mastoid air cells are predominantly clear. Included orbital structures are unremarkable. Other: None IMPRESSION: 1. No acute intracranial abnormality. 2. Mild parenchymal volume loss and chronic microvascular angiopathy. Electronically Signed   By: Kreg Shropshire M.D.   On: 10/15/2019 00:34   US Abdomen Limited  Result Date: 10/15/2019 CLINICAL DATA:  Jaundice. EXAM: ULTRASOUND ABDOMEN LIMITED RIGHT UPPER QUADRANT COMPARISON:  CT dated Oct 15, 2019 FINDINGS: Gallbladder: The gallbladder wall is thickened measuring approximately 6 mm. There are gallstones with gallbladder sludge. However, the sonographic Eulah Pont sign is negative. Common bile duct: Diameter: 3 mm Liver: Diffuse increased echogenicity with slightly heterogeneous liver. Appearance typically secondary to fatty infiltration. Fibrosis secondary consideration. No secondary findings of cirrhosis noted. No focal hepatic lesion or intrahepatic biliary duct dilatation. The liver surface is nodular concerning for cirrhosis. Portal vein is patent on color Doppler imaging with normal direction of blood flow towards the liver. Other: There is a moderate to large volume of abdominal ascites. IMPRESSION: 1. Findings are  equivocal for acute cholecystitis. While there is gallbladder wall thickening in the presence of cholelithiasis, the sonographic Eulah Pont sign is negative. Correlation with laboratory studies is recommended. Gallbladder wall thickening is a nonspecific finding can be seen in the presence of ascites. 2. Large volume abdominal ascites. 3. Hepatic steatosis. There is a nodular contour of the liver suggestive of underlying cirrhosis. Electronically Signed   By: Katherine Mantle M.D.   On: 10/15/2019 02:02   DG Chest Port 1 View  Result Date: 10/14/2019 CLINICAL DATA:  Hepatitis C, cirrhosis, altered level of consciousness EXAM: PORTABLE CHEST 1 VIEW COMPARISON:  08/17/2019 FINDINGS: The heart size and mediastinal contours are within normal limits. Both lungs are clear. The visualized skeletal structures are unremarkable. IMPRESSION: No active disease. Electronically Signed   By: Sharlet Salina M.D.   On: 10/14/2019 23:23   CT Renal Stone Study  Result Date: 10/15/2019 CLINICAL DATA:  Flank pain. Kidney stones suspected. EXAM: CT ABDOMEN AND PELVIS WITHOUT CONTRAST TECHNIQUE: Multidetector CT imaging of the abdomen and pelvis was performed following the standard protocol without IV contrast. COMPARISON:  None. FINDINGS: Lower chest: There are trace bilateral pleural effusions.The heart size is normal. The intracardiac blood pool is hypodense relative to the adjacent myocardium consistent with anemia. Hepatobiliary: There is severe diffuse hepatic steatosis. There is cholelithiasis. There may be some gallbladder wall thickening.There is no biliary ductal dilation. Pancreas: Normal contours without ductal dilatation. No peripancreatic fluid collection. Spleen: Unremarkable. Adrenals/Urinary Tract: --Adrenal glands: Unremarkable. --Right kidney/ureter: No hydronephrosis or radiopaque kidney stones. --Left kidney/ureter: No hydronephrosis or radiopaque kidney stones. --Urinary bladder: Unremarkable. Stomach/Bowel:  --  Stomach/Duodenum: There is a moderate-sized hiatal hernia. --Small bowel: Unremarkable. --Colon: Unremarkable. --Appendix: Normal. Vascular/Lymphatic: Atherosclerotic calcification is present within the non-aneurysmal abdominal aorta, without hemodynamically significant stenosis. --No retroperitoneal lymphadenopathy. --No mesenteric lymphadenopathy. --No pelvic or inguinal lymphadenopathy. Reproductive: Unremarkable Other: There is a moderate to large volume of ascites. The abdominal wall is normal. Musculoskeletal. No acute displaced fractures. IMPRESSION: 1. No radiopaque kidney stones. 2. Severe diffuse hepatic steatosis. 3. Cholelithiasis with possible gallbladder wall thickening. If there is concern for acute cholecystitis, recommend further evaluation with right upper quadrant ultrasound. 4. Moderate to large volume of ascites. 5. Trace bilateral pleural effusions. Aortic Atherosclerosis (ICD10-I70.0). Electronically Signed   By: Katherine Mantle M.D.   On: 10/15/2019 00:35    Procedures .Critical Care Performed by: Dierdre Forth, PA-C Authorized by: Dierdre Forth, PA-C   Critical care provider statement:    Critical care time (minutes):  60   Critical care time was exclusive of:  Separately billable procedures and treating other patients and teaching time   Critical care was necessary to treat or prevent imminent or life-threatening deterioration of the following conditions:  Hepatic failure and metabolic crisis   Critical care was time spent personally by me on the following activities:  Discussions with consultants, evaluation of patient's response to treatment, examination of patient, ordering and performing treatments and interventions, ordering and review of laboratory studies, ordering and review of radiographic studies, pulse oximetry, re-evaluation of patient's condition, obtaining history from patient or surrogate and review of old charts   I assumed direction of  critical care for this patient from another provider in my specialty: no     (including critical care time)  Medications Ordered in ED Medications  sodium chloride flush (NS) 0.9 % injection 3 mL (0 mLs Intravenous Hold 10/14/19 2238)  levothyroxine (SYNTHROID) tablet 125 mcg (has no administration in time range)  lactulose (CHRONULAC) 10 GM/15ML solution 20 g (20 g Oral Given 10/15/19 0450)  pantoprazole (PROTONIX) EC tablet 40 mg (has no administration in time range)  sodium chloride flush (NS) 0.9 % injection 3 mL (3 mLs Intravenous Given 10/15/19 0447)  0.9 % NaCl with KCl 20 mEq/ L  infusion ( Intravenous Paused 10/15/19 0616)  ondansetron (ZOFRAN) tablet 4 mg (has no administration in time range)    Or  ondansetron (ZOFRAN) injection 4 mg (has no administration in time range)  albumin human 25 % solution 50 g (has no administration in time range)  LORazepam (ATIVAN) tablet 1-4 mg (has no administration in time range)    Or  LORazepam (ATIVAN) injection 1-4 mg (has no administration in time range)  thiamine tablet 100 mg (has no administration in time range)    Or  thiamine (B-1) injection 100 mg (has no administration in time range)  folic acid (FOLVITE) tablet 1 mg (has no administration in time range)  multivitamin with minerals tablet 1 tablet (has no administration in time range)  LORazepam (ATIVAN) injection 0-4 mg (0 mg Intravenous Not Given 10/15/19 0444)    Followed by  LORazepam (ATIVAN) injection 0-4 mg (has no administration in time range)  albumin human 25 % solution 25 g (has no administration in time range)  potassium chloride SA (KLOR-CON) CR tablet 80 mEq (80 mEq Oral Given 10/14/19 2330)  potassium chloride 10 mEq in 100 mL IVPB ( Intravenous Stopped 10/15/19 0414)  magnesium sulfate IVPB 2 g 50 mL (0 g Intravenous Stopped 10/15/19 0031)  sodium chloride 0.9 % bolus 1,000 mL (0 mLs Intravenous  Stopped 10/15/19 0414)    ED Course  I have reviewed the triage vital signs  and the nursing notes.  Pertinent labs & imaging results that were available during my care of the patient were reviewed by me and considered in my medical decision making (see chart for details).  Clinical Course as of Oct 15 646  Mon Oct 15, 2019  0007 3 g drop in hemoglobin from 1 month ago.  Patient denies bloody bowel movements.  Hemoglobin(!): 8.9 [HM]  0007 Critically low potassium.  Oral and IV given along with magnesium.  Potassium(!!): <2.0 [HM]  0008 Acute kidney injury noted; creatinine double what it was 1 month ago.  Creatinine(!): 1.62 [HM]  0008 Severely elevated anion gap.  Anion gap(!): 21 [HM]  0008 No previous values noted; significantly elevated.  Total Bilirubin(!!): 22.3 [HM]  0008 Leukocytosis noted, no leukocytosis 1 month ago.  WBC(!): 11.6 [HM]    Clinical Course User Index [HM] Taleeyah Bora, Gwenlyn Perking   MDM Rules/Calculators/A&P                       Patient presents with significantly altered mental status and severe jaundice.  Lab abnormalities are numerous and severe.  Anemia, potassium less than 2, AKI, metabolic acidosis, elevated T bili and leukocytosis.  Patient is afebrile.  Abdomen is soft and nontender.  Due to numerous falls and liver dysfunction CT head was obtained.  No evidence of intracranial hemorrhage.  Patient denies headache or neck pain.  Additionally, CT abdomen obtained which shows large volume ascites consistent with physical exam.  Additionally Coley lithiasis is noted with questionable gallbladder wall thickening.  Patient without abdominal pain or tenderness to palpation however ultrasound ordered for further evaluation.  Will contact surgery if cholecystitis is suspected.  Potassium and magnesium given.  Fluids given.  Lactic is elevated as well.  Patient will be admitted for further evaluation and treatment.  The patient was discussed with and seen by Dr. Darl Householder who agrees with the treatment plan.  1:27 AM Discussed patient's  case with hospitalist, Dr. Myna Hidalgo.  I have recommended admission and patient (and family if present) agree with this plan. Admitting physician will place admission orders.    Final Clinical Impression(s) / ED Diagnoses Final diagnoses:  Decompensated hepatic cirrhosis ([place])  AKI (acute kidney injury) ([place])  Hepatic encephalopathy ([place])  Hypokalemia  Metabolic acidosis  Jaundice    Rx / DC Orders ED Discharge Orders    None       Loni Muse Gwenlyn Perking 10/15/19 5284    Drenda Freeze, MD 10/17/19 [phone]

## 2019-10-15 NOTE — Progress Notes (Signed)
Nurse notified of critical potassium of 2.4, provider notified and 5 runs of PIV K ordered.   PT had large liquid BM that was green with yellow staining. PT has yet to have UOP since being admitted to the stepdown unit. PT has been bladder scanned twice since admittance, the first was recorded as 250 ml , the second 262 ml. Provider notified and orders placed for albumin replacement. PT has been unable to receive large amounts of rehydration needed due to third spacing.   PT has been cooperative with care but is completely withdrawn and will not answer any of my questions, follow commands, or open his eyes. I informed the doctor that I am unable to give him PO medications or fluids at this time because of this. BP's remain soft, will call for additional orders as needed. Will continue to assess patient frequently for change in status or worsening condition.

## 2019-10-15 NOTE — ED Notes (Signed)
Brother at bedside 

## 2019-10-15 NOTE — H&P (Signed)
History and Physical    Corey Fernandez ZOX:[id number] DOB: 16-Feb-1966 DOA: 10/14/2019  PCP: [name], [name], MD   Patient coming from: Home   Chief Complaint: Confusion   HPI: Corey Fernandez is a 54 y.o. male with medical history significant for alcohol abuse, hepatitis C, and hypothyroidism, now presenting to emergency department with altered mental status.  Patient's brother is at the bedside to assist with the history.  Patient was reportedly admitted to hospital in [place] few months ago for complications related to his liver disease, was felt to be doing fairly well back at home after that but continues to drink alcohol per report of his brother.  He seemed to be confused on the phone last week and then beginning 10/11/2019, the patient's family members were unable to reach him by phone and became concerned.  Police were asked to visit the patient's house for a welfare check and found him to be confused and with marked jaundice.  The patient's brother, Corey Fernandez drove here from out of town and brought the patient to the hospital.  Patient is unable to provide much history but denies any pain and denies vomiting or bleeding.  Patient's brother notes that there was a bottle of lactulose at the patient's home that had already expired but appeared to be full.  ED Course: Upon arrival to the ED, patient is found to be afebrile, saturating low 90s on room air, and with SBP in the 90s.  EKG features a sinus rhythm.  Chest x-rays negative for acute cardiopulmonary disease.  Noncontrast head CT is negative for acute intracranial abnormality.  CT the abdomen pelvis notable for cholelithiasis with possible gallbladder wall thickening and moderate to large ascites.  Chemistry panel notable for sodium 130, potassium <2.0, magnesium 1.2, total bilirubin 22, AST 102, ALT 76, and creatinine 1.62, up from 0.8 in March.  CBC features a microcytic anemia with hemoglobin 8.9, down from 12.0 in March.  INR is 1.7.  Ammonia  is elevated to 81.  Acetaminophen undetectable.  Patient was given IV and oral potassium, IV magnesium, and 1 L of IV fluids.  COVID-19 screening test not yet resulted.  Review of Systems:  All other systems reviewed and apart from HPI, are negative.  Past Medical History:  Diagnosis Date  . Hypertension   . Hypothyroidism     Past Surgical History:  Procedure Laterality Date  . COLONOSCOPY N/A 05/07/2015   Procedure: COLONOSCOPY;  Surgeon: Malissa HippoNajeeb U Rehman, MD;  Location: [facility];  Service: Endoscopy;  Laterality: N/A;  925  . ESOPHAGOGASTRODUODENOSCOPY       reports that he has quit smoking. His smoking use included cigarettes. He has a 20.00 pack-year smoking history. He does not have any smokeless tobacco history on file. He reports that he does not drink alcohol or use drugs.  No Known Allergies  History reviewed. No pertinent family history.   Prior to Admission medications   Medication Sig Start Date End Date Taking? Authorizing Provider  amLODipine (NORVASC) 5 MG tablet Take 5 mg by mouth daily.   Yes [provider]  Cholecalciferol (VITAMIN D3) 1.25 MG (50000 UT) CAPS Take 1 capsule by mouth once a week. 08/01/19  Yes [provider]  folic acid (FOLVITE) 1 MG tablet Take 1 mg by mouth daily. 10/05/19  Yes [provider]  lactulose (CHRONULAC) 10 GM/15ML solution Take 10 g by mouth daily.   Yes [provider]  levothyroxine (SYNTHROID, LEVOTHROID) 125 MCG tablet Take 125  mcg by mouth daily before breakfast.   Yes [provider]  metoprolol succinate (TOPROL-XL) 25 MG 24 hr tablet Take 25 mg by mouth daily. 08/02/19  Yes [provider]  Multiple Vitamins-Minerals (MULTIVITAMINS THER. W/MINERALS) TABS tablet Take 1 tablet by mouth daily.   Yes [provider]  pantoprazole (PROTONIX) 40 MG tablet Take 40 mg by mouth daily. 10/05/19  Yes [provider]  spironolactone (ALDACTONE) 25 MG tablet Take  12.5 mg by mouth daily. 07/30/19  Yes [provider]    Physical Exam: Vitals:   10/14/19 2300 10/15/19 0020 10/15/19 0041 10/15/19 0100  BP: 106/73 105/62 90/64 101/77  Pulse: 91  90 91  Resp: 15 17 16 14   Temp:      TempSrc:      SpO2: 91%  97% 97%    Constitutional: NAD, jaundice   Eyes: PERTLA, lids and conjunctivae normal ENMT: Mucous membranes are moist. Posterior pharynx clear of any exudate or lesions.   Neck: normal, supple, no masses, no thyromegaly Respiratory: clear to auscultation bilaterally, no wheezing, no crackles. No accessory muscle use.  Cardiovascular: S1 & S2 heard, regular rate and rhythm. Pedal edema bilaterally. Abdomen: Distended, soft, non-tender. Bowel sounds active.  Musculoskeletal: no clubbing / cyanosis. No joint deformity upper and lower extremities.   Skin: Jaundice. Warm, dry, well-perfused. Neurologic: CN 2-12 grossly intact. Sensation intact. Strength 5/5 in all 4 limbs. Resting tremor involving bilateral UEs.  Psychiatric: Awake, slow to respond and oriented to person only. Does not recognize his brother at bedside. Calm, cooperative.       Labs and Imaging on Admission: I have personally reviewed following labs and imaging studies  CBC: Recent Labs  Lab 10/14/19 2155  WBC 11.6*  HGB 8.9*  HCT 25.3*  MCV 104.1*  PLT 096   Basic Metabolic Panel: Recent Labs  Lab 10/14/19 2155 10/14/19 2330  NA 130*  --   K <2.0*  --   CL 69*  --   CO2 40*  --   GLUCOSE 84  --   BUN 8  --   CREATININE 1.62*  --   CALCIUM 8.1*  --   MG  --  1.2*   GFR: CrCl cannot be calculated (Unknown ideal weight.). Liver Function Tests: Recent Labs  Lab 10/14/19 2155  AST 102*  ALT 76*  ALKPHOS 201*  BILITOT 22.3*  PROT 6.0*  ALBUMIN 1.8*   Recent Labs  Lab 10/14/19 2155  LIPASE 66*   Recent Labs  Lab 10/14/19 2330  AMMONIA 81*   Coagulation Profile: Recent Labs  Lab 10/14/19 2330  INR 1.7*   Cardiac Enzymes: No results  for input(s): CKTOTAL, CKMB, CKMBINDEX, TROPONINI in the last 168 hours. BNP (last 3 results) No results for input(s): PROBNP in the last 8760 hours. HbA1C: No results for input(s): HGBA1C in the last 72 hours. CBG: No results for input(s): GLUCAP in the last 168 hours. Lipid Profile: No results for input(s): CHOL, HDL, LDLCALC, TRIG, CHOLHDL, LDLDIRECT in the last 72 hours. Thyroid Function Tests: No results for input(s): TSH, T4TOTAL, FREET4, T3FREE, THYROIDAB in the last 72 hours. Anemia Panel: No results for input(s): VITAMINB12, FOLATE, FERRITIN, TIBC, IRON, RETICCTPCT in the last 72 hours. Urine analysis: No results found for: COLORURINE, APPEARANCEUR, LABSPEC, PHURINE, GLUCOSEU, HGBUR, BILIRUBINUR, KETONESUR, PROTEINUR, UROBILINOGEN, NITRITE, LEUKOCYTESUR Sepsis Labs: @LABRCNTIP (procalcitonin:4,lacticidven:4) ) Recent Results (from the past 240 hour(s))  SARS Coronavirus 2 by RT PCR (hospital order, performed in [facility] hospital lab)  Nasopharyngeal Nasopharyngeal Swab     Status: None   Collection Time: 10/15/19 12:21 AM   Specimen: Nasopharyngeal Swab  Result Value Ref Range Status   SARS Coronavirus 2 NEGATIVE NEGATIVE Final    Comment: (NOTE) SARS-CoV-2 target nucleic acids are NOT DETECTED. The SARS-CoV-2 RNA is generally detectable in upper and lower respiratory specimens during the acute phase of infection. The lowest concentration of SARS-CoV-2 viral copies this assay can detect is 250 copies / mL. A negative result does not preclude SARS-CoV-2 infection and should not be used as the sole basis for treatment or other patient management decisions.  A negative result may occur with improper specimen collection / handling, submission of specimen other than nasopharyngeal swab, presence of viral mutation(s) within the areas targeted by this assay, and inadequate number of viral copies (<250 copies / mL). A negative result must be combined with clinical observations,  patient history, and epidemiological information. Fact Sheet for Patients:   BoilerBrush.com.cy Fact Sheet for Healthcare Providers: https://pope.com/ This test is not yet approved or cleared  by the [place] FDA and has been authorized for detection and/or diagnosis of SARS-CoV-2 by FDA under an Emergency Use Authorization (EUA).  This EUA will remain in effect (meaning this test can be used) for the duration of the COVID-19 declaration under Section 564(b)(1) of the Act, 21 U.S.C. section 360bbb-3(b)(1), unless the authorization is terminated or revoked sooner. Performed at [facility], [address], [place], [place] [address]      Radiological Exams on Admission: CT Head Wo Contrast  Result Date: 10/15/2019 CLINICAL DATA:  Recurrent falls, liver failure, cirrhosis and hepatitis C EXAM: CT HEAD WITHOUT CONTRAST TECHNIQUE: Contiguous axial images were obtained from the base of the skull through the vertex without intravenous contrast. COMPARISON:  None. FINDINGS: Brain: No evidence of acute infarction, hemorrhage, hydrocephalus, extra-axial collection or mass lesion/mass effect. Symmetric prominence of the ventricles, cisterns and sulci compatible with parenchymal volume loss. Patchy areas of white matter hypoattenuation are most compatible with chronic microvascular angiopathy. Vascular: Atherosclerotic calcification of the carotid siphons. No hyperdense vessel. Skull: No calvarial fracture or suspicious osseous lesion. No scalp swelling or hematoma. Sinuses/Orbits: Paranasal sinuses and mastoid air cells are predominantly clear. Included orbital structures are unremarkable. Other: None IMPRESSION: 1. No acute intracranial abnormality. 2. Mild parenchymal volume loss and chronic microvascular angiopathy. Electronically Signed   By: Kreg Shropshire M.D.   On: 10/15/2019 00:34   US Abdomen Limited  Result Date:  10/15/2019 CLINICAL DATA:  Jaundice. EXAM: ULTRASOUND ABDOMEN LIMITED RIGHT UPPER QUADRANT COMPARISON:  CT dated Oct 15, 2019 FINDINGS: Gallbladder: The gallbladder wall is thickened measuring approximately 6 mm. There are gallstones with gallbladder sludge. However, the sonographic Eulah Pont sign is negative. Common bile duct: Diameter: 3 mm Liver: Diffuse increased echogenicity with slightly heterogeneous liver. Appearance typically secondary to fatty infiltration. Fibrosis secondary consideration. No secondary findings of cirrhosis noted. No focal hepatic lesion or intrahepatic biliary duct dilatation. The liver surface is nodular concerning for cirrhosis. Portal vein is patent on color Doppler imaging with normal direction of blood flow towards the liver. Other: There is a moderate to large volume of abdominal ascites. IMPRESSION: 1. Findings are equivocal for acute cholecystitis. While there is gallbladder wall thickening in the presence of cholelithiasis, the sonographic Eulah Pont sign is negative. Correlation with laboratory studies is recommended. Gallbladder wall thickening is a nonspecific finding can be seen in the presence of ascites. 2. Large volume abdominal ascites. 3. Hepatic steatosis.  There is a nodular contour of the liver suggestive of underlying cirrhosis. Electronically Signed   By: Katherine Mantle M.D.   On: 10/15/2019 02:02   DG Chest Port 1 View  Result Date: 10/14/2019 CLINICAL DATA:  Hepatitis C, cirrhosis, altered level of consciousness EXAM: PORTABLE CHEST 1 VIEW COMPARISON:  08/17/2019 FINDINGS: The heart size and mediastinal contours are within normal limits. Both lungs are clear. The visualized skeletal structures are unremarkable. IMPRESSION: No active disease. Electronically Signed   By: Sharlet Salina M.D.   On: 10/14/2019 23:23   CT Renal Stone Study  Result Date: 10/15/2019 CLINICAL DATA:  Flank pain. Kidney stones suspected. EXAM: CT ABDOMEN AND PELVIS WITHOUT CONTRAST  TECHNIQUE: Multidetector CT imaging of the abdomen and pelvis was performed following the standard protocol without IV contrast. COMPARISON:  None. FINDINGS: Lower chest: There are trace bilateral pleural effusions.The heart size is normal. The intracardiac blood pool is hypodense relative to the adjacent myocardium consistent with anemia. Hepatobiliary: There is severe diffuse hepatic steatosis. There is cholelithiasis. There may be some gallbladder wall thickening.There is no biliary ductal dilation. Pancreas: Normal contours without ductal dilatation. No peripancreatic fluid collection. Spleen: Unremarkable. Adrenals/Urinary Tract: --Adrenal glands: Unremarkable. --Right kidney/ureter: No hydronephrosis or radiopaque kidney stones. --Left kidney/ureter: No hydronephrosis or radiopaque kidney stones. --Urinary bladder: Unremarkable. Stomach/Bowel: --Stomach/Duodenum: There is a moderate-sized hiatal hernia. --Small bowel: Unremarkable. --Colon: Unremarkable. --Appendix: Normal. Vascular/Lymphatic: Atherosclerotic calcification is present within the non-aneurysmal abdominal aorta, without hemodynamically significant stenosis. --No retroperitoneal lymphadenopathy. --No mesenteric lymphadenopathy. --No pelvic or inguinal lymphadenopathy. Reproductive: Unremarkable Other: There is a moderate to large volume of ascites. The abdominal wall is normal. Musculoskeletal. No acute displaced fractures. IMPRESSION: 1. No radiopaque kidney stones. 2. Severe diffuse hepatic steatosis. 3. Cholelithiasis with possible gallbladder wall thickening. If there is concern for acute cholecystitis, recommend further evaluation with right upper quadrant ultrasound. 4. Moderate to large volume of ascites. 5. Trace bilateral pleural effusions. Aortic Atherosclerosis (ICD10-I70.0). Electronically Signed   By: Katherine Mantle M.D.   On: 10/15/2019 00:35    EKG: Independently reviewed. Sinus rhythm.   Assessment/Plan  1. Acute hepatic  encephalopathy; decompensated liver cirrhosis   - Presents with confusion and is found to have marked jaundice, no focal neurologic deficits or acute findings on head CT, and ammonia level 81  - Precipitant unclear  - Patient non-compliant with lactulose at home per family  - Start lactulose, replace electrolytes, consult IR for paracentesis, give albumin with paracentesis, hold diuretic while hydrating with IVF, continue PPI, encourage strict alcohol avoidance, monitor for withdrawal with CIWA, supplement vitamins    2. Acute kidney injury  - SCr is 1.62 in ED, up from 0.82 in March 2021  - No hydronephrosis on CT in ED  - Check urine chemistries, renally-dose medications, hold diuretic, continue IVF hydration, and repeat chem panel in am   3. Hypokalemia; hypomagnesemia  - Potassium is <2.0 and magnesium 1.2 in ED  - Replacement begun in ED, repeat chem panel in am    4. Anemia  - Hgb is 8.9 in ED, down from 12.0 in March 2021  - No obvious bleeding  - Check anemia panel, monitor   5. Hypothyroidism  - Continue Synthroid     DVT prophylaxis: SCDs  Code Status: Full  Family Communication: Brother Corey Fernandez) updated at bedside  Disposition Plan:  Patient is from: Home  Anticipated d/c is to: TBD  Anticipated d/c date is: 10/19/19  Patient currently: Critically-ill and pending ongoing  workup and treatment  Consults called: None  Admission status: Inpatient     Briscoe Deutscher, MD [facility] Pager: See [email or web address]  If 7AM-7PM, please contact the daytime attending [email or web address]  10/15/2019, 2:29 AM

## 2019-10-15 NOTE — ED Notes (Signed)
Pt transported to US

## 2019-10-15 NOTE — ED Notes (Signed)
Pt one person assistance to bedside commode.  Pt assisted into clean gown, bed pad and top sheet changed.  Peri care performed, moderate assistance.

## 2019-10-15 NOTE — Procedures (Signed)
PROCEDURE SUMMARY:  Telephone consent obtained from patient's brother.   Successful US guided paracentesis from right lateral abdomen.  Yielded 3750 ml of clear yellow fluid.  No immediate complications.  Pt tolerated well.   Specimen sent for labs.  EBL < 57mL  Mickie Kay, NP 10/15/2019 10:23 AM

## 2019-10-15 NOTE — ED Notes (Signed)
Date and time results received: 10/15/19 7:22 AM   Test: Billirubin Critical Value: 19.4  Test: Potassium Critical Value: 2.4  Name of Provider Notified: Rai, MD   Orders Received? Or Actions Taken?: Orders Received - See Orders for details

## 2019-10-15 NOTE — Consult Note (Signed)
Referring Provider:  [place] Primary Care Physician:  Lucia Gaskins, MD Primary Gastroenterologist: Althia Forts  Reason for Consultation: Jaundice, abnormal LFTs   HPI: Corey Fernandez is a 54 y.o. male with past medical history of alcohol abuse and possible diagnosis of hepatitis C along with cirrhosis of the liver in 2012 according to patient's brother, brought into the hospital for further evaluation of jaundice and abdominal distention.  Upon initial evaluation yesterday, he was found to have total bilirubin of 22.3, mildly elevated AST, ALT and alkaline phosphatase, mildly elevated lipase, INR 1.7, normal platelet count.  GI is consulted for further evaluation.  Patient seen and examined at bedside in the emergency room.  Patient somewhat confused unable to give limited history.  History obtained with help of patient's brother.  He was admitted to [facility] few times in 2012 and again in 2018 for alcohol-related liver disease and hepatitis C.  He stopped taking alcohol until earlier this year when he started drinking again.  According to patient's brother, he did not had jaundice until end of March of this year.  Patient is complaining of weakness and fatigue.  Denies seeing any blood in the stool or black stool.  Not sure about any weight loss.  Denies any nausea or vomiting.  Patient has been on lactulose since 2012 according to patient's brother.  Also he does not take his medications as prescribed.  Father died of cirrhosis from alcohol use.  Regarding his hepatitis C, we do not have any records of treatment.   No previous EGD.  Last colonoscopy was in 2016 by Dr. Laural Golden.   Past Medical History:  Diagnosis Date  . Hypertension   . Hypothyroidism     Past Surgical History:  Procedure Laterality Date  . COLONOSCOPY N/A 05/07/2015   Procedure: COLONOSCOPY;  Surgeon: Rogene Houston, MD;  Location: [facility];  Service: Endoscopy;  Laterality: N/A;  925  . ESOPHAGOGASTRODUODENOSCOPY       Prior to Admission medications   Medication Sig Start Date End Date Taking? Authorizing Provider  amLODipine (NORVASC) 5 MG tablet Take 5 mg by mouth daily.   Yes [provider]  Cholecalciferol (VITAMIN D3) 1.25 MG (50000 UT) CAPS Take 1 capsule by mouth once a week. 08/01/19  Yes [provider]  folic acid (FOLVITE) 1 MG tablet Take 1 mg by mouth daily. 10/05/19  Yes [provider]  lactulose (CHRONULAC) 10 GM/15ML solution Take 10 g by mouth daily.   Yes [provider]  levothyroxine (SYNTHROID, LEVOTHROID) 125 MCG tablet Take 125 mcg by mouth daily before breakfast.   Yes [provider]  metoprolol succinate (TOPROL-XL) 25 MG 24 hr tablet Take 25 mg by mouth daily. 08/02/19  Yes [provider]  Multiple Vitamins-Minerals (MULTIVITAMINS THER. W/MINERALS) TABS tablet Take 1 tablet by mouth daily.   Yes [provider]  pantoprazole (PROTONIX) 40 MG tablet Take 40 mg by mouth daily. 10/05/19  Yes [provider]  spironolactone (ALDACTONE) 25 MG tablet Take 12.5 mg by mouth daily. 07/30/19  Yes [provider]    Scheduled Meds: . lidocaine      . folic acid  1 mg Oral Daily  . lactulose  20 g Oral TID  . levothyroxine  125 mcg Oral QAC breakfast  . LORazepam  0-4 mg Intravenous Q4H   Followed by  . [START ON 10/17/2019] LORazepam  0-4 mg Intravenous Q8H  . multivitamin with minerals  1 tablet Oral Daily  . pantoprazole  40 mg Oral Daily  . potassium chloride  40 mEq Oral Q4H  . sodium chloride flush  3 mL Intravenous Once  . sodium chloride flush  3 mL Intravenous Q12H  . thiamine  100 mg Oral Daily   Or  . thiamine  100 mg Intravenous Daily   Continuous Infusions: . 0.9 % NaCl with KCl 20 mEq / L Stopped (10/15/19 0616)  . albumin human    . magnesium sulfate bolus IVPB     PRN Meds:.albumin human, LORazepam **OR** LORazepam, ondansetron **OR** ondansetron (ZOFRAN) IV  Allergies as of  10/14/2019  . (No Known Allergies)    History reviewed. No pertinent family history.  Social History   Socioeconomic History  . Marital status: Single    Spouse name: Not on file  . Number of children: Not on file  . Years of education: Not on file  . Highest education level: Not on file  Occupational History  . Not on file  Tobacco Use  . Smoking status: Former Smoker    Packs/day: 1.00    Years: 20.00    Pack years: 20.00    Types: Cigarettes  Substance and Sexual Activity  . Alcohol use: No  . Drug use: No  . Sexual activity: Not on file  Other Topics Concern  . Not on file  Social History Narrative  . Not on file   Social Determinants of Health   Financial Resource Strain:   . Difficulty of Paying Living Expenses:   Food Insecurity:   . Worried About Programme researcher, broadcasting/film/video in the Last Year:   . Barista in the Last Year:   Transportation Needs:   . Freight forwarder (Medical):   Marland Kitchen Lack of Transportation (Non-Medical):   Physical Activity:   . Days of Exercise per Week:   . Minutes of Exercise per Session:   Stress:   . Feeling of Stress :   Social Connections:   . Frequency of Communication with Friends and Family:   . Frequency of Social Gatherings with Friends and Family:   . Attends Religious Services:   . Active Member of Clubs or Organizations:   . Attends Banker Meetings:   Marland Kitchen Marital Status:   Intimate Partner Violence:   . Fear of Current or Ex-Partner:   . Emotionally Abused:   Marland Kitchen Physically Abused:   . Sexually Abused:     Review of Systems: Limited review of system as mentioned in HPI.  Physical Exam: Vital signs: Vitals:   10/15/19 0800 10/15/19 0900  BP: 101/74 119/70  Pulse:    Resp: 16 (!) 21  Temp:    SpO2:       Physical Exam  Constitutional: He is oriented to person, place, and time.  Cachectic appearing patient  HENT:  Head: Normocephalic and atraumatic.  Mouth/Throat: No oropharyngeal exudate.   Poor oral hygiene  Eyes: EOM are normal. Scleral icterus is present.  Cardiovascular: Normal rate, regular rhythm and normal heart sounds.  Pulmonary/Chest: Effort normal. No respiratory distress.  Abdominal: Soft. Bowel sounds are normal. He exhibits distension. There is no abdominal tenderness. There is no rebound and no guarding.  Musculoskeletal:        General: Edema present. Normal range of motion.     Cervical back: Normal range of motion and neck supple.  Neurological: He is alert and oriented to person, place, and time.  Skin: Skin is warm. No erythema.  Psychiatric: He has a  normal mood and affect. Judgment and thought content normal.    GI:  Lab Results: Recent Labs    10/14/19 2155 10/15/19 0629  WBC 11.6* 9.1  HGB 8.9* 8.4*  HCT 25.3* 23.6*  PLT 158 125*   BMET Recent Labs    10/14/19 2155 10/15/19 0629  NA 130* 130*  K <2.0* 2.3*  CL 69* 75*  CO2 40* 36*  GLUCOSE 84 78  BUN 8 7  CREATININE 1.62* 1.41*  CALCIUM 8.1* 7.2*   LFT Recent Labs    10/14/19 2155 10/14/19 2330 10/15/19 0629  PROT   < >  --  4.9*  ALBUMIN   < >  --  1.5*  AST   < >  --  270*  ALT   < >  --  65*  ALKPHOS   < >  --  167*  BILITOT   < >  --  19.4*  BILIDIR  --  10.7*  --    < > = values in this interval not displayed.   PT/INR Recent Labs    10/14/19 2330  LABPROT 19.1*  INR 1.7*     Studies/Results: CT Head Wo Contrast  Result Date: 10/15/2019 CLINICAL DATA:  Recurrent falls, liver failure, cirrhosis and hepatitis C EXAM: CT HEAD WITHOUT CONTRAST TECHNIQUE: Contiguous axial images were obtained from the base of the skull through the vertex without intravenous contrast. COMPARISON:  None. FINDINGS: Brain: No evidence of acute infarction, hemorrhage, hydrocephalus, extra-axial collection or mass lesion/mass effect. Symmetric prominence of the ventricles, cisterns and sulci compatible with parenchymal volume loss. Patchy areas of white matter hypoattenuation are most  compatible with chronic microvascular angiopathy. Vascular: Atherosclerotic calcification of the carotid siphons. No hyperdense vessel. Skull: No calvarial fracture or suspicious osseous lesion. No scalp swelling or hematoma. Sinuses/Orbits: Paranasal sinuses and mastoid air cells are predominantly clear. Included orbital structures are unremarkable. Other: None IMPRESSION: 1. No acute intracranial abnormality. 2. Mild parenchymal volume loss and chronic microvascular angiopathy. Electronically Signed   By: Kreg Shropshire M.D.   On: 10/15/2019 00:34   US Abdomen Limited  Result Date: 10/15/2019 CLINICAL DATA:  Jaundice. EXAM: ULTRASOUND ABDOMEN LIMITED RIGHT UPPER QUADRANT COMPARISON:  CT dated Oct 15, 2019 FINDINGS: Gallbladder: The gallbladder wall is thickened measuring approximately 6 mm. There are gallstones with gallbladder sludge. However, the sonographic Eulah Pont sign is negative. Common bile duct: Diameter: 3 mm Liver: Diffuse increased echogenicity with slightly heterogeneous liver. Appearance typically secondary to fatty infiltration. Fibrosis secondary consideration. No secondary findings of cirrhosis noted. No focal hepatic lesion or intrahepatic biliary duct dilatation. The liver surface is nodular concerning for cirrhosis. Portal vein is patent on color Doppler imaging with normal direction of blood flow towards the liver. Other: There is a moderate to large volume of abdominal ascites. IMPRESSION: 1. Findings are equivocal for acute cholecystitis. While there is gallbladder wall thickening in the presence of cholelithiasis, the sonographic Eulah Pont sign is negative. Correlation with laboratory studies is recommended. Gallbladder wall thickening is a nonspecific finding can be seen in the presence of ascites. 2. Large volume abdominal ascites. 3. Hepatic steatosis. There is a nodular contour of the liver suggestive of underlying cirrhosis. Electronically Signed   By: Katherine Mantle M.D.   On:  10/15/2019 02:02   DG Chest Port 1 View  Result Date: 10/14/2019 CLINICAL DATA:  Hepatitis C, cirrhosis, altered level of consciousness EXAM: PORTABLE CHEST 1 VIEW COMPARISON:  08/17/2019 FINDINGS: The heart size and mediastinal contours are within  normal limits. Both lungs are clear. The visualized skeletal structures are unremarkable. IMPRESSION: No active disease. Electronically Signed   By: Sharlet Salina M.D.   On: 10/14/2019 23:23   CT Renal Stone Study  Result Date: 10/15/2019 CLINICAL DATA:  Flank pain. Kidney stones suspected. EXAM: CT ABDOMEN AND PELVIS WITHOUT CONTRAST TECHNIQUE: Multidetector CT imaging of the abdomen and pelvis was performed following the standard protocol without IV contrast. COMPARISON:  None. FINDINGS: Lower chest: There are trace bilateral pleural effusions.The heart size is normal. The intracardiac blood pool is hypodense relative to the adjacent myocardium consistent with anemia. Hepatobiliary: There is severe diffuse hepatic steatosis. There is cholelithiasis. There may be some gallbladder wall thickening.There is no biliary ductal dilation. Pancreas: Normal contours without ductal dilatation. No peripancreatic fluid collection. Spleen: Unremarkable. Adrenals/Urinary Tract: --Adrenal glands: Unremarkable. --Right kidney/ureter: No hydronephrosis or radiopaque kidney stones. --Left kidney/ureter: No hydronephrosis or radiopaque kidney stones. --Urinary bladder: Unremarkable. Stomach/Bowel: --Stomach/Duodenum: There is a moderate-sized hiatal hernia. --Small bowel: Unremarkable. --Colon: Unremarkable. --Appendix: Normal. Vascular/Lymphatic: Atherosclerotic calcification is present within the non-aneurysmal abdominal aorta, without hemodynamically significant stenosis. --No retroperitoneal lymphadenopathy. --No mesenteric lymphadenopathy. --No pelvic or inguinal lymphadenopathy. Reproductive: Unremarkable Other: There is a moderate to large volume of ascites. The abdominal  wall is normal. Musculoskeletal. No acute displaced fractures. IMPRESSION: 1. No radiopaque kidney stones. 2. Severe diffuse hepatic steatosis. 3. Cholelithiasis with possible gallbladder wall thickening. If there is concern for acute cholecystitis, recommend further evaluation with right upper quadrant ultrasound. 4. Moderate to large volume of ascites. 5. Trace bilateral pleural effusions. Aortic Atherosclerosis (ICD10-I70.0). Electronically Signed   By: Katherine Mantle M.D.   On: 10/15/2019 00:35    Impression/Plan: -Jaundice with total bilirubin of 22.3 on admission.  AST 102, ALT 76 and alkaline phosphatase 201.  Most likely decompensated cirrhosis versus alcoholic hepatitis.  Discriminant function score of 40.2. MELD 32 as of yesterday -History of hepatitis C. hepatitis panel negative. -Ongoing alcohol use -Ascites -Hepatic encephalopathy.  He has been on lactulose since 2012 according to brother. -Electrolyte imbalance  Recommendations ------------------------- -Paracentesis to rule out SBP. -Check HCV PCR. -Repeat blood work tomorrow recalculate discriminant function score before starting prednisolone. -Consider starting low-dose diuretics once kidney function improves -Poor prognosis discussed with patient brother.  Absolute alcohol abstinence advised. -GI will follow   LOS: 0 days   Kathi Der  MD, FACP 10/15/2019, 9:25 AM  Contact #  [phone]

## 2019-10-15 NOTE — ED Notes (Signed)
CRITICAL VALUE STICKER  CRITICAL VALUE: Lactic 5.7  RECEIVER (on-site recipient of call): Leta Jungling T RN  DATE & TIME NOTIFIED: 10/15/19 0020  MESSENGER (representative from lab): Vince  PROVIDER NOTIFIED: Cyndia Skeeters., PA  TIME OF NOTIFICATION: 0025  RESPONSE: see orders

## 2019-10-15 NOTE — Progress Notes (Signed)
[facility]                                                                              Patient Demographics  Corey Fernandez, is a 55 y.o. male, DOB - 1966-05-07, WUJ:[id number]  Admit date - 10/14/2019   Admitting Physician Vianne Bulls, MD  Outpatient Primary MD for the patient is Lucia Gaskins, MD  Outpatient specialists:   LOS - 0  days   Medical records reviewed and are as summarized below:    Chief Complaint  Patient presents with  . Altered Mental Status       Brief summary   Patient is a 54 year old male with a history of alcohol abuse, hepatitis C, hypothyroidism presented to ED with altered mental status.  Per patient's brother,Patient was reportedly admitted to hospital in [place] few months ago for complications related to his liver disease, was felt to be doing fairly well back at home after that but continues to drink alcohol per report of his brother.  He seemed to be confused on the phone last week and then beginning 10/11/2019, the patient's family members were unable to reach him by phone and became concerned.  Police were asked to visit the patient's house for a welfare check and found him to be confused and with marked jaundice.  The patient's brother, Rocklin Soderquist drove here from out of town and brought the patient to the hospital.  Patient's brother notes that there was a bottle of lactulose at the patient's home that had already expired but appeared to be full. Noncontrast head CT is negative for acute intracranial abnormality.  CT the abdomen pelvis notable for cholelithiasis with possible gallbladder wall thickening and moderate to large ascites.  Chemistry panel notable for sodium 130, potassium <2.0, magnesium 1.2, total bilirubin 22, AST 102, ALT 76, and creatinine 1.62, up from 0.8 in March.  CBC features a microcytic anemia with hemoglobin 8.9, down from 12.0 in March.  INR is 1.7.  Ammonia is elevated to 81.  Acetaminophen undetectable.   Assessment & Plan    Principal Problem:   Acute hepatic encephalopathy, decompensated liver cirrhosis, Ascites, alcoholic hepatitis  -Patient presented with confusion, found to have marked jaundice, bilirubin 22.3, INR 1.7, creatinine 1.62, sodium 130.  Meld score 31, overall poor prognosis.  Continues to drink alcohol -Patient started on lactulose -Ultrasound guided paracentesis today, 3.75 L removed, labs pending.   -Gastroenterology consulted, role of steroids?  Discussed with Dr. Alessandra Bevels -Albumin 25 g x 1  Active Problems:  Acute kidney injury with lactic acidosis, hyponatremia -Unclear if patient had been eating or drinking well, lives alone, however at risk of hepatorenal syndrome with decompensated liver cirrhosis -Patient receiving IV fluid hydration   Alcohol abuse -Placed on CIWA protocol with Ativan, continue thiamine, folate    Hypothyroidism -TSH 0.071, obtain free T4, T3 -Decrease Synthroid to 100 MCG daily  Severe hypokalemia with hypomagnesemia -Replaced IV both potassium and magnesium    Hyponatremia -Possibly due to dehydration, alcoholic cirrhosis, beer potomania -Continue gentle hydration, will recheck bmet later today -TSH 0.07, decreasing Synthroid    Macrocytic anemia -Likely due to #1,  alcohol use, B12 folate normal    Coagulopathy (HCC), thrombocytopenia -Secondary to #1, INR 1.7   Severe protein calorie malnutrition Estimated body mass index is 19.2 kg/m as calculated from the following:   Height as of 08/17/19: 5\' 9"  (1.753 m).   Weight as of 08/17/19: 59 kg.  Albumin 1.5 -Ultrasound-guided paracentesis with albumin infusion  Goals of care Discussed with patient's brother who drove from 08/19/19, at the bedside, per brother, patient had been admitted in [place] in January 2021 for the same.  He improved to then came to [facility] has been living alone, likely has been drinking as well, noncompliant with his medications.  Explained about  patient's decompensated liver cirrhosis, marked jaundice, acute kidney injury with multiple electrolyte abnormalities, poor prognosis. Per patient's brother, Corey Fernandez, goal is to get him better through this admission and take him to [name] where his mother and family members live for close supervision. He conferred with his family members and understands the overall poor prognosis, requested DNR/DNI status   Code Status: DNR/DNI DVT Prophylaxis:   SCD's Family Communication: Discussed all imaging results, lab results, explained to the patient brother at the bedside   Disposition Plan:     Status is: Inpatient  Remains inpatient appropriate because:Inpatient level of care appropriate due to severity of illness   Dispo: The patient is from: Home              Anticipated d/c is to: Undetermined              Anticipated d/c date is: > 3 days              Patient currently is not medically stable to d/c.       Time Spent in minutes   35 minutes, at the bedside, examination of the patient, discussing with patient's brother about the management and goals of care  Procedures:  None  Consultants:   Gastroenterology, Dr. [place]  Antimicrobials:   Anti-infectives (From admission, onward)   None          Medications  Scheduled Meds: . folic acid  1 mg Oral Daily  . lactulose  20 g Oral TID  . levothyroxine  125 mcg Oral QAC breakfast  . lidocaine      . LORazepam  0-4 mg Intravenous Q4H   Followed by  . [START ON 10/17/2019] LORazepam  0-4 mg Intravenous Q8H  . multivitamin with minerals  1 tablet Oral Daily  . pantoprazole  40 mg Oral Daily  . potassium chloride  40 mEq Oral Q4H  . sodium chloride flush  3 mL Intravenous Once  . sodium chloride flush  3 mL Intravenous Q12H  . thiamine  100 mg Oral Daily   Or  . thiamine  100 mg Intravenous Daily   Continuous Infusions: . 0.9 % NaCl with KCl 20 mEq / L 100 mL/hr at 10/15/19 1017  . albumin human    .  magnesium sulfate bolus IVPB 4 g (10/15/19 1022)   PRN Meds:.albumin human, LORazepam **OR** LORazepam, ondansetron **OR** ondansetron (ZOFRAN) IV      Subjective:   Corey Fernandez was seen and examined today.  Marked jaundice, only oriented to self, review of system difficult to obtain from patient due to his mental status, fidgeting with his telemetry wires.  No fevers or chills.  Objective:   Vitals:   10/15/19 0940 10/15/19 0945 10/15/19 0950 10/15/19 0955  BP: 106/63 100/68 101/62 101/68  Pulse:  Resp:      Temp:      TempSrc:      SpO2:        Intake/Output Summary (Last 24 hours) at 10/15/2019 1054 Last data filed at 10/15/2019 1016 Gross per 24 hour  Intake 1683.39 ml  Output -  Net 1683.39 ml     Wt Readings from Last 3 Encounters:  08/17/19 59 kg  05/07/15 74.4 kg     Exam  General: Alert and oriented x self, marked jaundice, scleral icterus  Cardiovascular: S1 S2 auscultated, no murmurs, RRR  Respiratory: Clear to auscultation bilaterally, no wheezing, rales or rhonchi  Gastrointestinal: Soft, nontender, distended, + bowel sounds  Ext: 2+ pedal edema bilaterally  Neuro: Moving all 4 extremities spontaneously but does not follow commands  Musculoskeletal: No digital cyanosis, clubbing  Skin: No rashes  Psych: Very confused   Data Reviewed:  I have personally reviewed following labs and imaging studies  Micro Results Recent Results (from the past 240 hour(s))  SARS Coronavirus 2 by RT PCR (hospital order, performed in [facility] hospital lab) Nasopharyngeal Nasopharyngeal Swab     Status: None   Collection Time: 10/15/19 12:21 AM   Specimen: Nasopharyngeal Swab  Result Value Ref Range Status   SARS Coronavirus 2 NEGATIVE NEGATIVE Final    Comment: (NOTE) SARS-CoV-2 target nucleic acids are NOT DETECTED. The SARS-CoV-2 RNA is generally detectable in upper and lower respiratory specimens during the acute phase of infection. The lowest  concentration of SARS-CoV-2 viral copies this assay can detect is 250 copies / mL. A negative result does not preclude SARS-CoV-2 infection and should not be used as the sole basis for treatment or other patient management decisions.  A negative result may occur with improper specimen collection / handling, submission of specimen other than nasopharyngeal swab, presence of viral mutation(s) within the areas targeted by this assay, and inadequate number of viral copies (<250 copies / mL). A negative result must be combined with clinical observations, patient history, and epidemiological information. Fact Sheet for Patients:   BoilerBrush.com.cy Fact Sheet for Healthcare Providers: https://pope.com/ This test is not yet approved or cleared  by the [place] FDA and has been authorized for detection and/or diagnosis of SARS-CoV-2 by FDA under an Emergency Use Authorization (EUA).  This EUA will remain in effect (meaning this test can be used) for the duration of the COVID-19 declaration under Section 564(b)(1) of the Act, 21 U.S.C. section 360bbb-3(b)(1), unless the authorization is terminated or revoked sooner. Performed at [facility] [address] [place], [place] [address]     Radiology Reports CT Head Wo Contrast  Result Date: 10/15/2019 CLINICAL DATA:  Recurrent falls, liver failure, cirrhosis and hepatitis C EXAM: CT HEAD WITHOUT CONTRAST TECHNIQUE: Contiguous axial images were obtained from the base of the skull through the vertex without intravenous contrast. COMPARISON:  None. FINDINGS: Brain: No evidence of acute infarction, hemorrhage, hydrocephalus, extra-axial collection or mass lesion/mass effect. Symmetric prominence of the ventricles, cisterns and sulci compatible with parenchymal volume loss. Patchy areas of white matter hypoattenuation are most compatible with chronic microvascular angiopathy.  Vascular: Atherosclerotic calcification of the carotid siphons. No hyperdense vessel. Skull: No calvarial fracture or suspicious osseous lesion. No scalp swelling or hematoma. Sinuses/Orbits: Paranasal sinuses and mastoid air cells are predominantly clear. Included orbital structures are unremarkable. Other: None IMPRESSION: 1. No acute intracranial abnormality. 2. Mild parenchymal volume loss and chronic microvascular angiopathy. Electronically Signed   By: Coralie Keens.D.  On: 10/15/2019 00:34   US Abdomen Limited  Result Date: 10/15/2019 CLINICAL DATA:  Jaundice. EXAM: ULTRASOUND ABDOMEN LIMITED RIGHT UPPER QUADRANT COMPARISON:  CT dated Oct 15, 2019 FINDINGS: Gallbladder: The gallbladder wall is thickened measuring approximately 6 mm. There are gallstones with gallbladder sludge. However, the sonographic Eulah PontMurphy sign is negative. Common bile duct: Diameter: 3 mm Liver: Diffuse increased echogenicity with slightly heterogeneous liver. Appearance typically secondary to fatty infiltration. Fibrosis secondary consideration. No secondary findings of cirrhosis noted. No focal hepatic lesion or intrahepatic biliary duct dilatation. The liver surface is nodular concerning for cirrhosis. Portal vein is patent on color Doppler imaging with normal direction of blood flow towards the liver. Other: There is a moderate to large volume of abdominal ascites. IMPRESSION: 1. Findings are equivocal for acute cholecystitis. While there is gallbladder wall thickening in the presence of cholelithiasis, the sonographic Eulah PontMurphy sign is negative. Correlation with laboratory studies is recommended. Gallbladder wall thickening is a nonspecific finding can be seen in the presence of ascites. 2. Large volume abdominal ascites. 3. Hepatic steatosis. There is a nodular contour of the liver suggestive of underlying cirrhosis. Electronically Signed   By: Katherine Mantlehristopher  Green M.D.   On: 10/15/2019 02:02   DG Chest Port 1 View  Result  Date: 10/14/2019 CLINICAL DATA:  Hepatitis C, cirrhosis, altered level of consciousness EXAM: PORTABLE CHEST 1 VIEW COMPARISON:  08/17/2019 FINDINGS: The heart size and mediastinal contours are within normal limits. Both lungs are clear. The visualized skeletal structures are unremarkable. IMPRESSION: No active disease. Electronically Signed   By: Sharlet SalinaMichael  Brown M.D.   On: 10/14/2019 23:23   CT Renal Stone Study  Result Date: 10/15/2019 CLINICAL DATA:  Flank pain. Kidney stones suspected. EXAM: CT ABDOMEN AND PELVIS WITHOUT CONTRAST TECHNIQUE: Multidetector CT imaging of the abdomen and pelvis was performed following the standard protocol without IV contrast. COMPARISON:  None. FINDINGS: Lower chest: There are trace bilateral pleural effusions.The heart size is normal. The intracardiac blood pool is hypodense relative to the adjacent myocardium consistent with anemia. Hepatobiliary: There is severe diffuse hepatic steatosis. There is cholelithiasis. There may be some gallbladder wall thickening.There is no biliary ductal dilation. Pancreas: Normal contours without ductal dilatation. No peripancreatic fluid collection. Spleen: Unremarkable. Adrenals/Urinary Tract: --Adrenal glands: Unremarkable. --Right kidney/ureter: No hydronephrosis or radiopaque kidney stones. --Left kidney/ureter: No hydronephrosis or radiopaque kidney stones. --Urinary bladder: Unremarkable. Stomach/Bowel: --Stomach/Duodenum: There is a moderate-sized hiatal hernia. --Small bowel: Unremarkable. --Colon: Unremarkable. --Appendix: Normal. Vascular/Lymphatic: Atherosclerotic calcification is present within the non-aneurysmal abdominal aorta, without hemodynamically significant stenosis. --No retroperitoneal lymphadenopathy. --No mesenteric lymphadenopathy. --No pelvic or inguinal lymphadenopathy. Reproductive: Unremarkable Other: There is a moderate to large volume of ascites. The abdominal wall is normal. Musculoskeletal. No acute displaced  fractures. IMPRESSION: 1. No radiopaque kidney stones. 2. Severe diffuse hepatic steatosis. 3. Cholelithiasis with possible gallbladder wall thickening. If there is concern for acute cholecystitis, recommend further evaluation with right upper quadrant ultrasound. 4. Moderate to large volume of ascites. 5. Trace bilateral pleural effusions. Aortic Atherosclerosis (ICD10-I70.0). Electronically Signed   By: Katherine Mantlehristopher  Green M.D.   On: 10/15/2019 00:35    Lab Data:  CBC: Recent Labs  Lab 10/14/19 2155 10/15/19 0629  WBC 11.6* 9.1  NEUTROABS  --  7.0  HGB 8.9* 8.4*  HCT 25.3* 23.6*  MCV 104.1* 104.9*  PLT 158 125*   Basic Metabolic Panel: Recent Labs  Lab 10/14/19 2155 10/14/19 2330 10/15/19 0629  NA 130*  --  130*  K <2.0*  --  2.3*  CL 69*  --  75*  CO2 40*  --  36*  GLUCOSE 84  --  78  BUN 8  --  7  CREATININE 1.62*  --  1.41*  CALCIUM 8.1*  --  7.2*  MG  --  1.2* 1.6*  PHOS  --   --  3.0   GFR: CrCl cannot be calculated (Unknown ideal weight.). Liver Function Tests: Recent Labs  Lab 10/14/19 2155 10/15/19 0629  AST 102* 270*  ALT 76* 65*  ALKPHOS 201* 167*  BILITOT 22.3* 19.4*  PROT 6.0* 4.9*  ALBUMIN 1.8* 1.5*   Recent Labs  Lab 10/14/19 2155  LIPASE 66*   Recent Labs  Lab 10/14/19 2330  AMMONIA 81*   Coagulation Profile: Recent Labs  Lab 10/14/19 2330  INR 1.7*   Cardiac Enzymes: No results for input(s): CKTOTAL, CKMB, CKMBINDEX, TROPONINI in the last 168 hours. BNP (last 3 results) No results for input(s): PROBNP in the last 8760 hours. HbA1C: No results for input(s): HGBA1C in the last 72 hours. CBG: No results for input(s): GLUCAP in the last 168 hours. Lipid Profile: No results for input(s): CHOL, HDL, LDLCALC, TRIG, CHOLHDL, LDLDIRECT in the last 72 hours. Thyroid Function Tests: Recent Labs    10/15/19 0319  TSH 0.071*   Anemia Panel: Recent Labs    10/15/19 0319  VITAMINB12 1,238*  FOLATE 18.0  FERRITIN 2,394*  TIBC 91*   IRON 102  RETICCTPCT 3.9*   Urine analysis: No results found for: COLORURINE, APPEARANCEUR, LABSPEC, PHURINE, GLUCOSEU, HGBUR, BILIRUBINUR, KETONESUR, PROTEINUR, UROBILINOGEN, NITRITE, LEUKOCYTESUR   Lameisha Schuenemann M.D. [facility] 10/15/2019, 10:54 AM   Call night coverage person covering after 7pm

## 2019-10-16 ENCOUNTER — Inpatient Hospital Stay (HOSPITAL_COMMUNITY)

## 2019-10-16 LAB — CBC WITH DIFFERENTIAL/PLATELET
Abs Immature Granulocytes: 0.05 10*3/uL (ref 0.00–0.07)
Basophils Absolute: 0 10*3/uL (ref 0.0–0.1)
Basophils Relative: 0 %
Eosinophils Absolute: 0.1 10*3/uL (ref 0.0–0.5)
Eosinophils Relative: 1 %
HCT: 20.8 % — ABNORMAL LOW (ref 39.0–52.0)
Hemoglobin: 7.2 g/dL — ABNORMAL LOW (ref 13.0–17.0)
Immature Granulocytes: 1 %
Lymphocytes Relative: 13 %
Lymphs Abs: 1.1 10*3/uL (ref 0.7–4.0)
MCH: 37.1 pg — ABNORMAL HIGH (ref 26.0–34.0)
MCHC: 34.6 g/dL (ref 30.0–36.0)
MCV: 107.2 fL — ABNORMAL HIGH (ref 80.0–100.0)
Monocytes Absolute: 0.8 10*3/uL (ref 0.1–1.0)
Monocytes Relative: 10 %
Neutro Abs: 6 10*3/uL (ref 1.7–7.7)
Neutrophils Relative %: 75 %
Platelets: 96 10*3/uL — ABNORMAL LOW (ref 150–400)
RBC: 1.94 MIL/uL — ABNORMAL LOW (ref 4.22–5.81)
RDW: 14.2 % (ref 11.5–15.5)
WBC: 8.1 10*3/uL (ref 4.0–10.5)
nRBC: 0 % (ref 0.0–0.2)

## 2019-10-16 LAB — ABO/RH: ABO/RH(D): A POS

## 2019-10-16 LAB — HCV RNA QUANT RFLX ULTRA OR GENOTYP
HCV RNA Qnt(log copy/mL): UNDETERMINED log10 IU/mL
HepC Qn: NOT DETECTED IU/mL

## 2019-10-16 LAB — BLOOD GAS, ARTERIAL
Acid-Base Excess: 11.1 mmol/L — ABNORMAL HIGH (ref 0.0–2.0)
Bicarbonate: 35.4 mmol/L — ABNORMAL HIGH (ref 20.0–28.0)
O2 Saturation: 97.4 %
Patient temperature: 97.5
pCO2 arterial: 46.4 mmHg (ref 32.0–48.0)
pH, Arterial: 7.495 — ABNORMAL HIGH (ref 7.350–7.450)
pO2, Arterial: 87.9 mmHg (ref 83.0–108.0)

## 2019-10-16 LAB — AMMONIA
Ammonia: 59 umol/L — ABNORMAL HIGH (ref 9–35)
Ammonia: 48 umol/L — ABNORMAL HIGH (ref 9–35)

## 2019-10-16 LAB — POTASSIUM: Potassium: 4.6 mmol/L (ref 3.5–5.1)

## 2019-10-16 LAB — HEMOGLOBIN AND HEMATOCRIT, BLOOD
HCT: 27.8 % — ABNORMAL LOW (ref 39.0–52.0)
Hemoglobin: 9.4 g/dL — ABNORMAL LOW (ref 13.0–17.0)

## 2019-10-16 LAB — COMPREHENSIVE METABOLIC PANEL
ALT: 56 U/L — ABNORMAL HIGH (ref 0–44)
AST: 198 U/L — ABNORMAL HIGH (ref 15–41)
Albumin: 2.9 g/dL — ABNORMAL LOW (ref 3.5–5.0)
Alkaline Phosphatase: 134 U/L — ABNORMAL HIGH (ref 38–126)
Anion gap: 14 (ref 5–15)
BUN: 7 mg/dL (ref 6–20)
CO2: 36 mmol/L — ABNORMAL HIGH (ref 22–32)
Calcium: 7.9 mg/dL — ABNORMAL LOW (ref 8.9–10.3)
Chloride: 81 mmol/L — ABNORMAL LOW (ref 98–111)
Creatinine, Ser: 1.23 mg/dL (ref 0.61–1.24)
GFR calc Af Amer: >60 mL/min (ref >60)
GFR calc non Af Amer: >60 mL/min (ref >60)
Glucose, Bld: 76 mg/dL (ref 70–99)
Potassium: 2.8 mmol/L — ABNORMAL LOW (ref 3.5–5.1)
Sodium: 131 mmol/L — ABNORMAL LOW (ref 135–145)
Total Bilirubin: 22.3 mg/dL (ref 0.3–1.2)
Total Protein: 5.7 g/dL — ABNORMAL LOW (ref 6.5–8.1)

## 2019-10-16 LAB — PATHOLOGIST SMEAR REVIEW

## 2019-10-16 LAB — PREPARE RBC (CROSSMATCH)

## 2019-10-16 LAB — PROTIME-INR
INR: 1.8 — ABNORMAL HIGH (ref 0.8–1.2)
Prothrombin Time: 20.3 seconds — ABNORMAL HIGH (ref 11.4–15.2)

## 2019-10-16 LAB — MAGNESIUM: Magnesium: 2.9 mg/dL — ABNORMAL HIGH (ref 1.7–2.4)

## 2019-10-16 MED ORDER — RIFAXIMIN 550 MG PO TABS
550.0000 mg | ORAL_TABLET | Freq: Two times a day (BID) | ORAL | Status: DC
  Administered 2019-10-16: 550 mg via ORAL
  Filled 2019-10-16 (×2): qty 1

## 2019-10-16 MED ORDER — LACTULOSE 10 GM/15ML PO SOLN
20.0000 g | Freq: Three times a day (TID) | ORAL | Status: DC
  Administered 2019-10-16 (×3): 20 g
  Filled 2019-10-16 (×3): qty 30

## 2019-10-16 MED ORDER — ADULT MULTIVITAMIN LIQUID CH
15.0000 mL | Freq: Every day | ORAL | Status: DC
  Administered 2019-10-16: 15 mL via ORAL
  Filled 2019-10-16: qty 15

## 2019-10-16 MED ORDER — POTASSIUM CHLORIDE 10 MEQ/100ML IV SOLN
10.0000 meq | INTRAVENOUS | Status: AC
  Administered 2019-10-16 (×5): 10 meq via INTRAVENOUS
  Filled 2019-10-16 (×4): qty 100

## 2019-10-16 MED ORDER — POTASSIUM CHLORIDE 10 MEQ/100ML IV SOLN
INTRAVENOUS | Status: AC
  Filled 2019-10-16: qty 100

## 2019-10-16 MED ORDER — PREDNISOLONE SODIUM PHOSPHATE 15 MG/5ML PO SOLN
40.0000 mg | Freq: Every day | ORAL | Status: DC
  Administered 2019-10-16 – 2019-10-18 (×3): 40 mg
  Filled 2019-10-16 (×4): qty 15

## 2019-10-16 MED ORDER — ALBUMIN HUMAN 25 % IV SOLN
25.0000 g | Freq: Once | INTRAVENOUS | Status: AC
  Administered 2019-10-16: 25 g via INTRAVENOUS
  Filled 2019-10-16: qty 100

## 2019-10-16 MED ORDER — FOLIC ACID 5 MG/ML IJ SOLN
1.0000 mg | Freq: Every day | INTRAMUSCULAR | Status: DC
  Administered 2019-10-16: 1 mg via INTRAVENOUS
  Filled 2019-10-16: qty 0.2

## 2019-10-16 MED ORDER — LEVOTHYROXINE SODIUM 100 MCG PO TABS
100.0000 ug | ORAL_TABLET | Freq: Every day | ORAL | Status: DC
  Administered 2019-10-17 – 2019-10-19 (×3): 100 ug
  Filled 2019-10-16 (×3): qty 1

## 2019-10-16 MED ORDER — ALBUMIN HUMAN 25 % IV SOLN: 25.0000 g | Freq: Once | INTRAVENOUS | Status: DC

## 2019-10-16 MED ORDER — PREDNISOLONE 5 MG PO TABS
40.0000 mg | ORAL_TABLET | Freq: Every day | ORAL | Status: DC
  Filled 2019-10-16: qty 8

## 2019-10-16 MED ORDER — RIFAXIMIN 550 MG PO TABS
550.0000 mg | ORAL_TABLET | Freq: Two times a day (BID) | ORAL | Status: DC
  Administered 2019-10-16 – 2019-10-18 (×5): 550 mg
  Filled 2019-10-16 (×7): qty 1

## 2019-10-16 MED ORDER — FOLIC ACID 1 MG PO TABS
1.0000 mg | ORAL_TABLET | Freq: Every day | ORAL | Status: DC
  Administered 2019-10-17 – 2019-10-18 (×2): 1 mg
  Filled 2019-10-16 (×3): qty 1

## 2019-10-16 MED ORDER — LACTULOSE ENEMA
300.0000 mL | Freq: Once | ORAL | Status: AC
  Administered 2019-10-16: 300 mL via RECTAL
  Filled 2019-10-16: qty 300

## 2019-10-16 MED ORDER — PANTOPRAZOLE SODIUM 40 MG PO PACK
40.0000 mg | PACK | Freq: Every day | ORAL | Status: DC
  Administered 2019-10-16 – 2019-10-18 (×3): 40 mg
  Filled 2019-10-16 (×3): qty 20

## 2019-10-16 MED ORDER — ALBUMIN HUMAN 25 % IV SOLN
INTRAVENOUS | Status: AC
  Filled 2019-10-16: qty 50

## 2019-10-16 MED ORDER — SODIUM CHLORIDE 0.9% IV SOLUTION: Freq: Once | INTRAVENOUS | Status: AC

## 2019-10-16 NOTE — Progress Notes (Signed)
[facility] Gastroenterology Progress Note  Corey Fernandez 54 y.o. [birth year]  CC: Cirrhosis, encephalopathy, abnormal LFTs   Subjective: Patient seen and examined at bedside.  Appears more confused today.  Not able to get history from patient.  Discussed with nursing staff.  Besides confusion, there is no acute GI issues such as bleeding or vomiting.  NG tube placed for medication management today.  ROS : Not able to obtain   Objective: Vital signs in last 24 hours: Vitals:   10/16/19 0715 10/16/19 0800  BP: 99/63 108/71  Pulse:    Resp:  15  Temp:  (!) 97.4 F (36.3 C)  SpO2:      Physical Exam:  General.  Patient lethargic and somewhat sleepy.  Not able to give any history.  NG tube in place Appears deeply jaundiced. Abdomen minimally distended.  Soft, bowel sounds present.  No peritoneal signs No lower extremity edema  Lab Results: Recent Labs    10/15/19 0629 10/15/19 0629 10/15/19 1911 10/16/19 0626  NA 130*  --   --  131*  K 2.3*   < > 2.4* 2.8*  CL 75*  --   --  81*  CO2 36*  --   --  36*  GLUCOSE 78  --   --  76  BUN 7  --   --  7  CREATININE 1.41*  --   --  1.23  CALCIUM 7.2*  --   --  7.9*  MG 1.6*   < > 2.8* 2.9*  PHOS 3.0  --   --   --    < > = values in this interval not displayed.   Recent Labs    10/15/19 0629 10/16/19 0626  AST 270* 198*  ALT 65* 56*  ALKPHOS 167* 134*  BILITOT 19.4* 22.3*  PROT 4.9* 5.7*  ALBUMIN 1.5* 2.9*   Recent Labs    10/15/19 0629 10/16/19 0626  WBC 9.1 8.1  NEUTROABS 7.0 6.0  HGB 8.4* 7.2*  HCT 23.6* 20.8*  MCV 104.9* 107.2*  PLT 125* 96*   Recent Labs    10/14/19 2330 10/16/19 0626  LABPROT 19.1* 20.3*  INR 1.7* 1.8*      Assessment/Plan: -Jaundice with total bilirubin of 22.3 on admission.  AST 102, ALT 76 and alkaline phosphatase 201.  Most likely decompensated cirrhosis versus alcoholic hepatitis.  Discriminant function score of 40.2. MELD 32 as of 05/23, discriminant function score of 45 05/25   -Hepatic encephalopathy.  He is more confused today. -History of hepatitis C. hepatitis panel negative. -Ongoing alcohol use -Ascites -Hepatic encephalopathy.  He has been on lactulose since 2012 according to brother. -Electrolyte imbalance  Recommendations ------------------------- -Paracentesis done yesterday.  Around 3.7 L fluid removed.  Negative for SBP. -Follow HCV PCR -Start rifaximin -Continue lactulose -Start prednisolone.  -Consider starting low-dose diuretics tomorrow. Borderline blood pressure today -Poor prognosis   -GI will follow   Kathi Der MD, FACP 10/16/2019, 10:44 AM  Contact #  [phone]

## 2019-10-16 NOTE — Progress Notes (Signed)
[facility]                                                                              Patient Demographics  Corey Fernandez, is a 54 y.o. male, DOB - 14-Jul-1965, AVW:[id number]  Admit date - 10/14/2019   Admitting Physician Briscoe Deutscher, MD  Outpatient Primary MD for the patient is Oval Linsey, MD  Outpatient specialists:   LOS - 1  days   Medical records reviewed and are as summarized below:    Chief Complaint  Patient presents with  . Altered Mental Status       Brief summary   Patient is a 54 year old male with a history of alcohol abuse, hepatitis C, hypothyroidism presented to ED with altered mental status.  Per patient's brother,Patient was reportedly admitted to hospital in [place] few months ago for complications related to his liver disease, was felt to be doing fairly well back at home after that but continues to drink alcohol per report of his brother.  He seemed to be confused on the phone last week and then beginning 10/11/2019, the patient's family members were unable to reach him by phone and became concerned.  Police were asked to visit the patient's house for a welfare check and found him to be confused and with marked jaundice.  The patient's brother, Corey Fernandez drove here from out of town and brought the patient to the hospital.  Patient's brother notes that there was a bottle of lactulose at the patient's home that had already expired but appeared to be full. Noncontrast head CT is negative for acute intracranial abnormality.  CT the abdomen pelvis notable for cholelithiasis with possible gallbladder wall thickening and moderate to large ascites.  Chemistry panel notable for sodium 130, potassium <2.0, magnesium 1.2, total bilirubin 22, AST 102, ALT 76, and creatinine 1.62, up from 0.8 in March.  CBC features a microcytic anemia with hemoglobin 8.9, down from 12.0 in March.  INR is 1.7.  Ammonia is elevated to 81.  Acetaminophen  undetectable.   Assessment & Plan    Principal Problem:   Acute hepatic encephalopathy, decompensated liver cirrhosis, Ascites, alcoholic hepatitis  -Patient presented with confusion, found to have marked jaundice, bilirubin 22.3, INR 1.7, creatinine 1.62, sodium 130.  Meld score 31, overall poor prognosis.  Continues to drink alcohol -Ultrasound guided paracentesis on 5/24, 3.75 L removed, no SBP -Difficulty giving him lactulose due to worsening mental status.   - Will place NG tube and continue lactulose 20 mg 3 times daily via NG, lactulose enema x1 -GI following  Active Problems:  Acute kidney injury with lactic acidosis -Creatinine 1.6 at the time of admission -Unclear if patient had been eating or drinking well, lives alone, however at risk of hepatorenal syndrome with decompensated liver cirrhosis -Patient was placed on IV fluid hydration, creatinine improved to 1.2   Hypokalemia, hypomagnesemia -Potassium 2.8, placed on IV potassium replacement, will not be able to give orally due to somnolence -Magnesium 2.9 today   Alcohol abuse -Placed on CIWA protocol with Ativan, continue thiamine, folate    Hypothyroidism -TSH 0.071, T4 slightly elevated 1.8 -Continue decreased  dose of Synthroid 100 MCG daily    Hyponatremia -Possibly due to dehydration, alcoholic cirrhosis, beer potomania -Continue gentle hydration, sodium slightly improving -TSH 0.07, decreasing Synthroid    Macrocytic anemia -Likely due to #1, alcohol use, B12 folate normal -Hemoglobin 7.2, will give 1 unit of packed RBC transfusion    Coagulopathy (HCC), thrombocytopenia -Secondary to #1, INR 1.8   Severe protein calorie malnutrition Estimated body mass index is 20.38 kg/m as calculated from the following:   Height as of this encounter: 5\' 9"  (1.753 m).   Weight as of this encounter: 62.6 kg.  Albumin 1.5 -Received albumin infusion  Goals of care discussion on 5/24 Discussed with patient's  brother who drove from 6/24, at the bedside, per brother, patient had been admitted in [place] in January 2021 for the same.  He improved to then came to [facility], has been living alone, likely has been drinking as well, noncompliant with his medications.  Explained about patient's decompensated liver cirrhosis, marked jaundice, acute kidney injury with multiple electrolyte abnormalities, poor prognosis. Per patient's brother, Corey Fernandez, goal is to get him better through this admission and take him to [name] where his mother and family members live for close supervision. He conferred with his family members and understands the overall poor prognosis, requested DNR/DNI status   Code Status: DNR/DNI DVT Prophylaxis:   SCD's Family Communication:   Disposition Plan:     Status is: Inpatient  Remains inpatient appropriate because:Inpatient level of care appropriate due to severity of illness   Dispo: The patient is from: Home              Anticipated d/c is to: Undetermined              Anticipated d/c date is: > 3 days              Patient currently is not medically stable to d/c.       Time Spent in minutes   35 minutes  Procedures:  Ultrasound-guided paracentesis 5/24  Consultants:   Gastroenterology, Dr. 6/24  Antimicrobials:   Anti-infectives (From admission, onward)   Start     Dose/Rate Route Frequency Ordered Stop   10/16/19 1200  rifaximin (XIFAXAN) tablet 550 mg     550 mg Oral 2 times daily 10/16/19 1041           Medications  Scheduled Meds: . sodium chloride   Intravenous Once  . Chlorhexidine Gluconate Cloth  6 each Topical Daily  . folic acid  1 mg Intravenous Daily  . lactulose  20 g Per Tube TID  . levothyroxine  100 mcg Oral Q0600  . LORazepam  0-4 mg Intravenous Q4H   Followed by  . [START ON 10/17/2019] LORazepam  0-4 mg Intravenous Q8H  . multivitamin with minerals  1 tablet Oral Daily  . pantoprazole  40 mg Oral Daily  .  rifaximin  550 mg Oral BID  . sodium chloride flush  3 mL Intravenous Q12H  . thiamine  100 mg Oral Daily   Or  . thiamine  100 mg Intravenous Daily   Continuous Infusions: . albumin human    . potassium chloride 10 mEq (10/16/19 1143)   PRN Meds:.albumin human, LORazepam **OR** LORazepam, ondansetron **OR** ondansetron (ZOFRAN) IV      Subjective:   Axle Parfait was seen and examined today.  Very somnolent, jaundiced.  Not been able to give oral lactulose due to lethargy.  Unable to obtain review of system  from the patient.  Had 2 doses of oral lactulose yesterday.  No fevers or chills. Objective:   Vitals:   10/16/19 0700 10/16/19 0715 10/16/19 0800 10/16/19 1115  BP: 99/63 99/63 108/71 110/72  Pulse:      Resp: 13  15   Temp:   (!) 97.4 F (36.3 C)   TempSrc:   Oral   SpO2:      Weight:      Height:        Intake/Output Summary (Last 24 hours) at 10/16/2019 1150 Last data filed at 10/16/2019 0545 Gross per 24 hour  Intake 1341.57 ml  Output 0 ml  Net 1341.57 ml     Wt Readings from Last 3 Encounters:  10/16/19 62.6 kg  08/17/19 59 kg  05/07/15 74.4 kg    Physical Exam  General: Somnolent, marked jaundice, scleral icterus  Cardiovascular: S1 S2 clear, RRR. No pedal edema b/l  Respiratory: CTAB, no wheezing, rales or rhonchi  Gastrointestinal: Soft, nontender, nondistended, NBS  Ext: 2+  pedal edema bilaterally  Neuro: Not following commands  Musculoskeletal: No cyanosis, clubbing  Skin: No rashes  Psych: Very lethargic    Data Reviewed:  I have personally reviewed following labs and imaging studies  Micro Results Recent Results (from the past 240 hour(s))  SARS Coronavirus 2 by RT PCR (hospital order, performed in [facility] lab) Nasopharyngeal Nasopharyngeal Swab     Status: None   Collection Time: 10/15/19 12:21 AM   Specimen: Nasopharyngeal Swab  Result Value Ref Range Status   SARS Coronavirus 2 NEGATIVE NEGATIVE Final     Comment: (NOTE) SARS-CoV-2 target nucleic acids are NOT DETECTED. The SARS-CoV-2 RNA is generally detectable in upper and lower respiratory specimens during the acute phase of infection. The lowest concentration of SARS-CoV-2 viral copies this assay can detect is 250 copies / mL. A negative result does not preclude SARS-CoV-2 infection and should not be used as the sole basis for treatment or other patient management decisions.  A negative result may occur with improper specimen collection / handling, submission of specimen other than nasopharyngeal swab, presence of viral mutation(s) within the areas targeted by this assay, and inadequate number of viral copies (<250 copies / mL). A negative result must be combined with clinical observations, patient history, and epidemiological information. Fact Sheet for Patients:   BoilerBrush.com.cyhttps://www.fda.gov/media/136312/download Fact Sheet for Healthcare Providers: https://pope.com/https://www.fda.gov/media/136313/download This test is not yet approved or cleared  by the [place] FDA and has been authorized for detection and/or diagnosis of SARS-CoV-2 by FDA under an Emergency Use Authorization (EUA).  This EUA will remain in effect (meaning this test can be used) for the duration of the COVID-19 declaration under Section 564(b)(1) of the Act, 21 U.S.C. section 360bbb-3(b)(1), unless the authorization is terminated or revoked sooner. Performed at [facility], [address] [place], [place] [address]   Gram stain     Status: None   Collection Time: 10/15/19 10:32 AM   Specimen: Peritoneal Washings  Result Value Ref Range Status   Specimen Description PERITONEAL FLUID  Final   Special Requests NONE  Final   Gram Stain   Final    WBC PRESENT, PREDOMINANTLY MONONUCLEAR NO ORGANISMS SEEN CYTOSPIN SMEAR Performed at [facility], [address] [place], [place] [address]    Report Status 10/15/2019 FINAL  Final  MRSA PCR Screening      Status: None   Collection Time: 10/15/19  3:22 PM   Specimen:  Nasopharyngeal  Result Value Ref Range Status   MRSA by PCR NEGATIVE NEGATIVE Final    Comment:        The GeneXpert MRSA Assay (FDA approved for NASAL specimens only), is one component of a comprehensive MRSA colonization surveillance program. It is not intended to diagnose MRSA infection nor to guide or monitor treatment for MRSA infections. Performed at [facility], [place] [address], [place], [place] [address]     Radiology Reports DG Abd 1 View  Result Date: 10/16/2019 CLINICAL DATA:  Check gastric catheter placement EXAM: ABDOMEN - 1 VIEW COMPARISON:  Film from earlier in the same day. FINDINGS: Gastric catheter has been advanced slightly into the stomach. Persistent small bowel dilatation is seen. No free air is noted. IMPRESSION: Gastric catheter within the stomach slightly deeper than that seen on the prior exam. Persistent small-bowel dilatation is seen. Electronically Signed   By: Inez Catalina M.D.   On: 10/16/2019 11:33   DG Abd 1 View  Result Date: 10/16/2019 CLINICAL DATA:  NG tube placement. EXAM: ABDOMEN - 1 VIEW COMPARISON:  CT, 10/14/2019. FINDINGS: NG tube tip projects in the proximal stomach. The side hole lies in the distal esophagus. Consider further insertion, approximately 10 cm, to allow the side-hole to fully into the stomach. IMPRESSION: NG tube with its tip projecting in the proximal stomach as detailed above. Electronically Signed   By: Lajean Manes M.D.   On: 10/16/2019 10:28   CT Head Wo Contrast  Result Date: 10/15/2019 CLINICAL DATA:  Recurrent falls, liver failure, cirrhosis and hepatitis C EXAM: CT HEAD WITHOUT CONTRAST TECHNIQUE: Contiguous axial images were obtained from the base of the skull through the vertex without intravenous contrast. COMPARISON:  None. FINDINGS: Brain: No evidence of acute infarction, hemorrhage, hydrocephalus, extra-axial collection or mass  lesion/mass effect. Symmetric prominence of the ventricles, cisterns and sulci compatible with parenchymal volume loss. Patchy areas of white matter hypoattenuation are most compatible with chronic microvascular angiopathy. Vascular: Atherosclerotic calcification of the carotid siphons. No hyperdense vessel. Skull: No calvarial fracture or suspicious osseous lesion. No scalp swelling or hematoma. Sinuses/Orbits: Paranasal sinuses and mastoid air cells are predominantly clear. Included orbital structures are unremarkable. Other: None IMPRESSION: 1. No acute intracranial abnormality. 2. Mild parenchymal volume loss and chronic microvascular angiopathy. Electronically Signed   By: Lovena Le M.D.   On: 10/15/2019 00:34   US Abdomen Limited  Result Date: 10/15/2019 CLINICAL DATA:  Jaundice. EXAM: ULTRASOUND ABDOMEN LIMITED RIGHT UPPER QUADRANT COMPARISON:  CT dated Oct 15, 2019 FINDINGS: Gallbladder: The gallbladder wall is thickened measuring approximately 6 mm. There are gallstones with gallbladder sludge. However, the sonographic Percell Miller sign is negative. Common bile duct: Diameter: 3 mm Liver: Diffuse increased echogenicity with slightly heterogeneous liver. Appearance typically secondary to fatty infiltration. Fibrosis secondary consideration. No secondary findings of cirrhosis noted. No focal hepatic lesion or intrahepatic biliary duct dilatation. The liver surface is nodular concerning for cirrhosis. Portal vein is patent on color Doppler imaging with normal direction of blood flow towards the liver. Other: There is a moderate to large volume of abdominal ascites. IMPRESSION: 1. Findings are equivocal for acute cholecystitis. While there is gallbladder wall thickening in the presence of cholelithiasis, the sonographic Percell Miller sign is negative. Correlation with laboratory studies is recommended. Gallbladder wall thickening is a nonspecific finding can be seen in the presence of ascites. 2. Large volume  abdominal ascites. 3. Hepatic steatosis. There is a nodular contour of the liver suggestive of underlying  cirrhosis. Electronically Signed   By: Katherine Mantle M.D.   On: 10/15/2019 02:02   US Paracentesis  Result Date: 10/15/2019 INDICATION: Patient with a history of alcoholism and decompensated liver cirrhosis. Patient presented to the ED with altered mental status and ascites. Interventional radiology asked to perform a diagnostic and therapeutic paracentesis. EXAM: ULTRASOUND GUIDED right PARACENTESIS MEDICATIONS: 1% lidocaine 10 mL COMPLICATIONS: None immediate PROCEDURE: Informed written consent was obtained from the patient after a discussion of the risks, benefits and alternatives to treatment. A timeout was performed prior to the initiation of the procedure. Initial ultrasound scanning demonstrates a large amount of ascites within the right lower abdominal quadrant. The right lower abdomen was prepped and draped in the usual sterile fashion. 1% lidocaine was used for local anesthesia. Following this, a 19 gauge, 7-cm, Yueh catheter was introduced. An ultrasound image was saved for documentation purposes. The paracentesis was performed. The catheter was removed and a dressing was applied. The patient tolerated the procedure well without immediate post procedural complication. Patient received post-procedure intravenous albumin; see nursing notes for details. FINDINGS: A total of approximately 3750 mL of clear yellow fluid was removed. Samples were sent to the laboratory as requested by the clinical team. IMPRESSION: Successful ultrasound-guided paracentesis yielding 3750 mL of peritoneal fluid. Read by: Alwyn Ren, NP Electronically Signed   By: Irish Lack M.D.   On: 10/15/2019 10:29   DG Chest Port 1 View  Result Date: 10/14/2019 CLINICAL DATA:  Hepatitis C, cirrhosis, altered level of consciousness EXAM: PORTABLE CHEST 1 VIEW COMPARISON:  08/17/2019 FINDINGS: The heart size and  mediastinal contours are within normal limits. Both lungs are clear. The visualized skeletal structures are unremarkable. IMPRESSION: No active disease. Electronically Signed   By: Sharlet Salina M.D.   On: 10/14/2019 23:23   CT Renal Stone Study  Result Date: 10/15/2019 CLINICAL DATA:  Flank pain. Kidney stones suspected. EXAM: CT ABDOMEN AND PELVIS WITHOUT CONTRAST TECHNIQUE: Multidetector CT imaging of the abdomen and pelvis was performed following the standard protocol without IV contrast. COMPARISON:  None. FINDINGS: Lower chest: There are trace bilateral pleural effusions.The heart size is normal. The intracardiac blood pool is hypodense relative to the adjacent myocardium consistent with anemia. Hepatobiliary: There is severe diffuse hepatic steatosis. There is cholelithiasis. There may be some gallbladder wall thickening.There is no biliary ductal dilation. Pancreas: Normal contours without ductal dilatation. No peripancreatic fluid collection. Spleen: Unremarkable. Adrenals/Urinary Tract: --Adrenal glands: Unremarkable. --Right kidney/ureter: No hydronephrosis or radiopaque kidney stones. --Left kidney/ureter: No hydronephrosis or radiopaque kidney stones. --Urinary bladder: Unremarkable. Stomach/Bowel: --Stomach/Duodenum: There is a moderate-sized hiatal hernia. --Small bowel: Unremarkable. --Colon: Unremarkable. --Appendix: Normal. Vascular/Lymphatic: Atherosclerotic calcification is present within the non-aneurysmal abdominal aorta, without hemodynamically significant stenosis. --No retroperitoneal lymphadenopathy. --No mesenteric lymphadenopathy. --No pelvic or inguinal lymphadenopathy. Reproductive: Unremarkable Other: There is a moderate to large volume of ascites. The abdominal wall is normal. Musculoskeletal. No acute displaced fractures. IMPRESSION: 1. No radiopaque kidney stones. 2. Severe diffuse hepatic steatosis. 3. Cholelithiasis with possible gallbladder wall thickening. If there is  concern for acute cholecystitis, recommend further evaluation with right upper quadrant ultrasound. 4. Moderate to large volume of ascites. 5. Trace bilateral pleural effusions. Aortic Atherosclerosis (ICD10-I70.0). Electronically Signed   By: Katherine Mantle M.D.   On: 10/15/2019 00:35    Lab Data:  CBC: Recent Labs  Lab 10/14/19 2155 10/15/19 0629 10/16/19 0626  WBC 11.6* 9.1 8.1  NEUTROABS  --  7.0 6.0  HGB 8.9* 8.4* 7.2*  HCT 25.3* 23.6* 20.8*  MCV 104.1* 104.9* 107.2*  PLT 158 125* 96*   Basic Metabolic Panel: Recent Labs  Lab 10/14/19 2155 10/14/19 2330 10/15/19 0629 10/15/19 1911 10/16/19 0626  NA 130*  --  130*  --  131*  K <2.0*  --  2.3* 2.4* 2.8*  CL 69*  --  75*  --  81*  CO2 40*  --  36*  --  36*  GLUCOSE 84  --  78  --  76  BUN 8  --  7  --  7  CREATININE 1.62*  --  1.41*  --  1.23  CALCIUM 8.1*  --  7.2*  --  7.9*  MG  --  1.2* 1.6* 2.8* 2.9*  PHOS  --   --  3.0  --   --    GFR: Estimated Creatinine Clearance: 60.8 mL/min (by C-G formula based on SCr of 1.23 mg/dL). Liver Function Tests: Recent Labs  Lab 10/14/19 2155 10/15/19 0629 10/16/19 0626  AST 102* 270* 198*  ALT 76* 65* 56*  ALKPHOS 201* 167* 134*  BILITOT 22.3* 19.4* 22.3*  PROT 6.0* 4.9* 5.7*  ALBUMIN 1.8* 1.5* 2.9*   Recent Labs  Lab 10/14/19 2155  LIPASE 66*   Recent Labs  Lab 10/14/19 2330 10/16/19 0905  AMMONIA 81* 59*   Coagulation Profile: Recent Labs  Lab 10/14/19 2330 10/16/19 0626  INR 1.7* 1.8*   Cardiac Enzymes: No results for input(s): CKTOTAL, CKMB, CKMBINDEX, TROPONINI in the last 168 hours. BNP (last 3 results) No results for input(s): PROBNP in the last 8760 hours. HbA1C: No results for input(s): HGBA1C in the last 72 hours. CBG: No results for input(s): GLUCAP in the last 168 hours. Lipid Profile: No results for input(s): CHOL, HDL, LDLCALC, TRIG, CHOLHDL, LDLDIRECT in the last 72 hours. Thyroid Function Tests: Recent Labs    10/15/19 0319  10/15/19 1240  TSH 0.071*  --   FREET4  --  1.83*   Anemia Panel: Recent Labs    10/15/19 0319  VITAMINB12 1,238*  FOLATE 18.0  FERRITIN 2,394*  TIBC 91*  IRON 102  RETICCTPCT 3.9*   Urine analysis: No results found for: COLORURINE, APPEARANCEUR, LABSPEC, PHURINE, GLUCOSEU, HGBUR, BILIRUBINUR, KETONESUR, PROTEINUR, UROBILINOGEN, NITRITE, LEUKOCYTESUR   Kingston Shawgo M.D. [facility] 10/16/2019, 11:50 AM   Call night coverage person covering after 7pm

## 2019-10-16 NOTE — Progress Notes (Signed)
Notified Lab that ABG being sent for analysis. 

## 2019-10-16 NOTE — TOC Initial Note (Signed)
Transition of Care [facility]) - Initial/Assessment Note    Patient Details  Name: Corey Fernandez MRN: [id number] Date of Birth: 10/04/1965  [facility]) CM/SW Contact:    Golda Acre, RN Phone Number: 10/16/2019, 9:16 AM  Clinical Narrative:                 etoh abuse and w/d, ciwa protocol with ativan, lives alone has a brother, will need snf placement due to condition. From home has pcp Plan is for snf placement at this time. Expected Discharge Plan: Skilled Nursing Facility Barriers to Discharge: Continued Medical Work up   Patient Goals and CMS Choice Patient states their goals for this hospitalization and ongoing recovery are:: not stated due to ams   Choice offered to / list presented to : Sibling, [facility] POA / Guardian  Expected Discharge Plan and Services Expected Discharge Plan: Skilled Nursing Facility   Discharge Planning Services: CM Consult Post Acute Care Choice: Skilled Nursing Facility Living arrangements for the past 2 months: Single Family Home                                      Prior Living Arrangements/Services Living arrangements for the past 2 months: Single Family Home Lives with:: Self                   Activities of Daily Living Home Assistive Devices/Equipment: Eyeglasses ADL Screening (condition at time of admission) Patient's cognitive ability adequate to safely complete daily activities?: No Is the patient deaf or have difficulty hearing?: No Does the patient have difficulty seeing, even when wearing glasses/contacts?: No Does the patient have difficulty concentrating, remembering, or making decisions?: Yes Patient able to express need for assistance with ADLs?: Yes Does the patient have difficulty dressing or bathing?: Yes Independently performs ADLs?: No Communication: Independent Dressing (OT): Needs assistance Is this a change from baseline?: Change from baseline, expected to last >3 days Grooming: Needs  assistance Is this a change from baseline?: Change from baseline, expected to last >3 days Feeding: Needs assistance Is this a change from baseline?: Change from baseline, expected to last >3 days Bathing: Needs assistance Is this a change from baseline?: Change from baseline, expected to last >3 days Toileting: Needs assistance Is this a change from baseline?: Change from baseline, expected to last >3days In/Out Bed: Needs assistance Is this a change from baseline?: Change from baseline, expected to last >3 days Walks in Home: Needs assistance Is this a change from baseline?: Change from baseline, expected to last >3 days Does the patient have difficulty walking or climbing stairs?: Yes Weakness of Legs: Both Weakness of Arms/Hands: None  Permission Sought/Granted                  Emotional Assessment       Orientation: : Fluctuating Orientation (Suspected and/or reported Sundowners) Alcohol / Substance Use: Tobacco Use, Alcohol Use Psych Involvement: No (comment)  Admission diagnosis:  Hepatic encephalopathy (HCC) [K72.90] Hypokalemia [E87.6] Cirrhosis (HCC) [K74.60] Metabolic acidosis [E87.2] Jaundice [R17] AKI (acute kidney injury) (HCC) [N17.9] Acute hepatic encephalopathy [K72.00] Decompensated hepatic cirrhosis (HCC) [K72.90, K74.60] Patient Active Problem List   Diagnosis Date Noted  . Acute hepatic encephalopathy 10/15/2019  . Decompensated hepatic cirrhosis (HCC) 10/15/2019  . Coagulopathy (HCC) 10/15/2019  . Hypothyroidism   . Hypokalemia   . Hypomagnesemia   . AKI (acute kidney injury) (HCC)   .  Hyponatremia   . Macrocytic anemia    PCP:  Lucia Gaskins, MD Pharmacy:   [place], [place] - [place] [address] [place] [address] [place] [address] Phone: [phone] Fax: [phone]     Social Determinants of Health (SDOH) Interventions    Readmission Risk Interventions No flowsheet data found.

## 2019-10-17 DIAGNOSIS — L8990 Pressure ulcer of unspecified site, unspecified stage: Secondary | ICD-10-CM | POA: Insufficient documentation

## 2019-10-17 LAB — COMPREHENSIVE METABOLIC PANEL
ALT: 51 U/L — ABNORMAL HIGH (ref 0–44)
AST: 150 U/L — ABNORMAL HIGH (ref 15–41)
Albumin: 2.7 g/dL — ABNORMAL LOW (ref 3.5–5.0)
Alkaline Phosphatase: 114 U/L (ref 38–126)
Anion gap: 13 (ref 5–15)
BUN: 8 mg/dL (ref 6–20)
CO2: 32 mmol/L (ref 22–32)
Calcium: 7.6 mg/dL — ABNORMAL LOW (ref 8.9–10.3)
Chloride: 85 mmol/L — ABNORMAL LOW (ref 98–111)
Creatinine, Ser: 0.96 mg/dL (ref 0.61–1.24)
GFR calc Af Amer: >60 mL/min (ref >60)
GFR calc non Af Amer: >60 mL/min (ref >60)
Glucose, Bld: 113 mg/dL — ABNORMAL HIGH (ref 70–99)
Potassium: 3.3 mmol/L — ABNORMAL LOW (ref 3.5–5.1)
Sodium: 130 mmol/L — ABNORMAL LOW (ref 135–145)
Total Bilirubin: 24.5 mg/dL (ref 0.3–1.2)
Total Protein: 5.1 g/dL — ABNORMAL LOW (ref 6.5–8.1)

## 2019-10-17 LAB — CBC WITH DIFFERENTIAL/PLATELET
Abs Immature Granulocytes: 0.06 10*3/uL (ref 0.00–0.07)
Basophils Absolute: 0 10*3/uL (ref 0.0–0.1)
Basophils Relative: 0 %
Eosinophils Absolute: 0 10*3/uL (ref 0.0–0.5)
Eosinophils Relative: 0 %
HCT: 25.3 % — ABNORMAL LOW (ref 39.0–52.0)
Hemoglobin: 8.6 g/dL — ABNORMAL LOW (ref 13.0–17.0)
Immature Granulocytes: 1 %
Lymphocytes Relative: 5 %
Lymphs Abs: 0.5 10*3/uL — ABNORMAL LOW (ref 0.7–4.0)
MCH: 35.1 pg — ABNORMAL HIGH (ref 26.0–34.0)
MCHC: 34 g/dL (ref 30.0–36.0)
MCV: 103.3 fL — ABNORMAL HIGH (ref 80.0–100.0)
Monocytes Absolute: 0.7 10*3/uL (ref 0.1–1.0)
Monocytes Relative: 6 %
Neutro Abs: 9.3 10*3/uL — ABNORMAL HIGH (ref 1.7–7.7)
Neutrophils Relative %: 88 %
Platelets: 77 10*3/uL — ABNORMAL LOW (ref 150–400)
RBC: 2.45 MIL/uL — ABNORMAL LOW (ref 4.22–5.81)
RDW: 17.4 % — ABNORMAL HIGH (ref 11.5–15.5)
WBC: 10.6 10*3/uL — ABNORMAL HIGH (ref 4.0–10.5)
nRBC: 0 % (ref 0.0–0.2)

## 2019-10-17 LAB — TYPE AND SCREEN
ABO/RH(D): A POS
Antibody Screen: NEGATIVE
Unit division: 0

## 2019-10-17 LAB — BPAM RBC
Blood Product Expiration Date: 202106012359
ISSUE DATE / TIME: 202105251234
Unit Type and Rh: 6200

## 2019-10-17 LAB — MAGNESIUM: Magnesium: 2.7 mg/dL — ABNORMAL HIGH (ref 1.7–2.4)

## 2019-10-17 LAB — PROTIME-INR
INR: 1.7 — ABNORMAL HIGH (ref 0.8–1.2)
Prothrombin Time: 19.5 seconds — ABNORMAL HIGH (ref 11.4–15.2)

## 2019-10-17 LAB — T3: T3, Total: 39 ng/dL — ABNORMAL LOW (ref 71–180)

## 2019-10-17 LAB — AMMONIA: Ammonia: 59 umol/L — ABNORMAL HIGH (ref 9–35)

## 2019-10-17 MED ORDER — FUROSEMIDE 20 MG PO TABS
20.0000 mg | ORAL_TABLET | Freq: Every day | ORAL | Status: DC
  Administered 2019-10-17: 20 mg via ORAL
  Filled 2019-10-17: qty 1

## 2019-10-17 MED ORDER — LACTULOSE 10 GM/15ML PO SOLN
30.0000 g | Freq: Three times a day (TID) | ORAL | Status: DC
  Administered 2019-10-17 – 2019-10-18 (×5): 30 g
  Filled 2019-10-17 (×5): qty 45

## 2019-10-17 MED ORDER — POTASSIUM CHLORIDE 10 MEQ/100ML IV SOLN
10.0000 meq | Freq: Once | INTRAVENOUS | Status: AC
  Administered 2019-10-17: 10 meq via INTRAVENOUS
  Filled 2019-10-17: qty 100

## 2019-10-17 MED ORDER — THIAMINE HCL 100 MG/ML IJ SOLN: 100.0000 mg | Freq: Every day | INTRAMUSCULAR | Status: DC

## 2019-10-17 MED ORDER — ADULT MULTIVITAMIN LIQUID CH
15.0000 mL | Freq: Every day | ORAL | Status: DC
  Administered 2019-10-17 – 2019-10-18 (×2): 15 mL
  Filled 2019-10-17 (×3): qty 15

## 2019-10-17 MED ORDER — THIAMINE HCL 100 MG PO TABS
100.0000 mg | ORAL_TABLET | Freq: Every day | ORAL | Status: DC
  Administered 2019-10-17 – 2019-10-18 (×2): 100 mg
  Filled 2019-10-17 (×3): qty 1

## 2019-10-17 MED ORDER — SPIRONOLACTONE 25 MG PO TABS
25.0000 mg | ORAL_TABLET | Freq: Every day | ORAL | Status: DC
  Administered 2019-10-17: 25 mg via ORAL
  Filled 2019-10-17: qty 1

## 2019-10-17 NOTE — Progress Notes (Signed)
[facility] Gastroenterology Progress Note  Corey Fernandez 54 y.o. 1965-11-11  CC: Cirrhosis, encephalopathy, abnormal LFTs   Subjective: Patient seen and examined at bedside.  Remains lethargic.  Opens eyes to verbal command.  Not able to give any history.  Discussed with RN.  Currently having only 1 bowel movements per day.   ROS : Not able to obtain   Objective: Vital signs in last 24 hours: Vitals:   10/17/19 0700 10/17/19 0800  BP: 102/66   Pulse: 97   Resp: 18   Temp:  98.3 F (36.8 C)  SpO2: 100%     Physical Exam:  General.  Patient lethargic and somewhat sleepy.  Not able to give any history.  NG tube in place Appears deeply jaundiced. Abdomen minimally distended.  Soft, bowel sounds present.  No peritoneal signs No lower extremity edema  Lab Results: Recent Labs    10/15/19 0629 10/15/19 1911 10/16/19 0626 10/16/19 0626 10/16/19 2033 10/17/19 0228  NA 130*  --  131*  --   --  130*  K 2.3*   < > 2.8*   < > 4.6 3.3*  CL 75*  --  81*  --   --  85*  CO2 36*  --  36*  --   --  32  GLUCOSE 78  --  76  --   --  113*  BUN 7  --  7  --   --  8  CREATININE 1.41*  --  1.23  --   --  0.96  CALCIUM 7.2*  --  7.9*  --   --  7.6*  MG 1.6*   < > 2.9*  --   --  2.7*  PHOS 3.0  --   --   --   --   --    < > = values in this interval not displayed.   Recent Labs    10/16/19 0626 10/17/19 0228  AST 198* 150*  ALT 56* 51*  ALKPHOS 134* 114  BILITOT 22.3* 24.5*  PROT 5.7* 5.1*  ALBUMIN 2.9* 2.7*   Recent Labs    10/16/19 0626 10/16/19 0626 10/16/19 1712 10/17/19 0228  WBC 8.1  --   --  10.6*  NEUTROABS 6.0  --   --  9.3*  HGB 7.2*   < > 9.4* 8.6*  HCT 20.8*   < > 27.8* 25.3*  MCV 107.2*  --   --  103.3*  PLT 96*  --   --  77*   < > = values in this interval not displayed.   Recent Labs    10/16/19 0626 10/17/19 0841  LABPROT 20.3* 19.5*  INR 1.8* 1.7*      Assessment/Plan: -Jaundice with total bilirubin of 22.3 on admission.  AST 102, ALT 76 and  alkaline phosphatase 201.  Most likely decompensated cirrhosis versus alcoholic hepatitis.  Discriminant function score of 40.2. MELD 32 as of 05/23, discriminant function score of 45 05/25  -Hepatic encephalopathy.  -History of hepatitis C. hepatitis panel and HCV PCR negative. -Ongoing alcohol use -Ascites -Hepatic encephalopathy.  He has been on lactulose since 2012 according to brother. -Electrolyte imbalance  Recommendations ------------------------- -Continue prednisolone  -Continue rifaximin.  Increase lactulose to have 2-3 bowel movements per day. -Add Lasix 20 mg and spironolactone 25 mg.  Hold for blood pressure less than 90/50 -Repeat blood work tomorrow  -Discussed with patient's brother over the phone.  Poor prognosis again discussed.  -GI will follow   Salli Bodin  MD, FACP 10/17/2019, 9:23 AM  Contact #  [phone]

## 2019-10-17 NOTE — Progress Notes (Signed)
[facility]                                                                              Patient Demographics  Corey Fernandez, is a 54 y.o. male, DOB - 17-Jun-1965, ZOX:[id number]  Admit date - 10/14/2019   Admitting Physician Briscoe Deutscherimothy S Opyd, MD  Outpatient Primary MD for the patient is [name], [name], MD  Outpatient specialists:   LOS - 2  days   Medical records reviewed and are as summarized below:    Chief Complaint  Patient presents with  . Altered Mental Status       Brief summary   Patient is a 54 year old male with a history of alcohol abuse, hepatitis C, hypothyroidism presented to ED with altered mental status.  Per patient's brother,Patient was reportedly admitted to hospital in [place] few months ago for complications related to his liver disease, was felt to be doing fairly well back at home after that but continues to drink alcohol per report of his brother.  He seemed to be confused on the phone last week and then beginning 10/11/2019, the patient's family members were unable to reach him by phone and became concerned.  Police were asked to visit the patient's house for a welfare check and found him to be confused and with marked jaundice.  The patient's brother, Corey GardnerShawn Fernandez drove here from out of town and brought the patient to the hospital.  Patient's brother notes that there was a bottle of lactulose at the patient's home that had already expired but appeared to be full. Noncontrast head CT is negative for acute intracranial abnormality.  CT the abdomen pelvis notable for cholelithiasis with possible gallbladder wall thickening and moderate to large ascites.  Chemistry panel notable for sodium 130, potassium <2.0, magnesium 1.2, total bilirubin 22, AST 102, ALT 76, and creatinine 1.62, up from 0.8 in March.  CBC features a microcytic anemia with hemoglobin 8.9, down from 12.0 in March.  INR is 1.7.  Ammonia is elevated to 81.  Acetaminophen  undetectable.   Assessment & Plan    Principal Problem:   Acute hepatic encephalopathy, decompensated liver cirrhosis, Ascites, alcoholic hepatitis  -Patient presented with confusion, found to have marked jaundice, bilirubin 22.3, INR 1.7, creatinine 1.62, sodium 130.  Meld score 31 on admission, overall poor prognosis.  Continues to drink alcohol -Ultrasound guided paracentesis on 5/24, 3.75 L removed, no SBP -Difficulty giving him lactulose due to his mental status, NG tube placed, still 1 BM yesterday, ammonia level trending up again 59, still very somnolent. -Increase lactulose to 30 g. 3 times daily via NG tube, rifaximin -Overall poor prognosis, bilirubin continues to trend up, discussed with patient's brother at the bedside -Started on prednisolone, added Lasix and spironolactone by GI  Active Problems:  Acute kidney injury with lactic acidosis -Creatinine 1.6 at the time of admission -Unclear if patient had been eating or drinking well, lives alone, however at risk of hepatorenal syndrome with decompensated liver cirrhosis -Creatinine improving, 0.9   Hypokalemia, hypomagnesemia -Potassium slightly low, replaced, magnesium now normalized   Alcohol abuse -Placed on CIWA protocol with Ativan, continue thiamine, folate  Hypothyroidism -TSH 0.071, T4 slightly elevated 1.8 -Continue decreased dose of Synthroid 100 MCG daily    Hyponatremia -Possibly due to dehydration, alcoholic cirrhosis, beer potomania -Continue gentle hydration, sodium slightly improving -TSH 0.07, decreased Synthroid    Macrocytic anemia, thrombocytopenia coagulopathy -Likely due to #1, alcohol use, B12 folate normal -Received 1 unit packed RBCs on 5/25 for hemoglobin of 7.2, hemoglobin stable at 8.6 today, no active bleeding -Platelets continue to trend down   Severe protein calorie malnutrition Estimated body mass index is 21.58 kg/m as calculated from the following:   Height as of this  encounter: 5\' 9"  (1.753 m).   Weight as of this encounter: 66.3 kg.  -Received albumin infusion  Goals of care discussion  Discussed with patient's brother, [name] (from Corey Fernandez) at the bedside, again today, overall poor prognosis.  Patient remains lethargic, MELD 32, discriminant function 45 with overall high risk of mortality.  No significant improvement since admission.  DNR/DNI status, palliative consult placed   Code Status: DNR/DNI DVT Prophylaxis:   SCD's Family Communication: Discussed with patient's brother at the bedside  Disposition Plan:     Status is: Inpatient  Remains inpatient appropriate because:Inpatient level of care appropriate due to severity of illness   Dispo: The patient is from: Home              Anticipated d/c is to: Undetermined              Anticipated d/c date is: > 3 days              Patient currently is not medically stable to d/c.       Time Spent in minutes   35 minutes  Procedures:  Ultrasound-guided paracentesis 5/24  Consultants:   Gastroenterology, Dr. 6/24  Antimicrobials:   Anti-infectives (From admission, onward)   Start     Dose/Rate Route Frequency Ordered Stop   10/16/19 2200  rifaximin (XIFAXAN) tablet 550 mg     550 mg Per Tube 2 times daily 10/16/19 1344     10/16/19 1200  rifaximin (XIFAXAN) tablet 550 mg  Status:  Discontinued     550 mg Oral 2 times daily 10/16/19 1041 10/16/19 1344         Medications  Scheduled Meds: . Chlorhexidine Gluconate Cloth  6 each Topical Daily  . folic acid  1 mg Per Tube Daily  . furosemide  20 mg Oral Daily  . lactulose  30 g Per Tube TID  . levothyroxine  100 mcg Per Tube Q0600  . LORazepam  0-4 mg Intravenous Q8H  . multivitamin  15 mL Per Tube Daily  . pantoprazole sodium  40 mg Per Tube Daily  . prednisoLONE  40 mg Per Tube QAC breakfast  . rifaximin  550 mg Per Tube BID  . sodium chloride flush  3 mL Intravenous Q12H  . spironolactone  25 mg Oral Daily  .  thiamine  100 mg Per Tube Daily   Or  . thiamine  100 mg Intravenous Daily   Continuous Infusions: . albumin human     PRN Meds:.albumin human, LORazepam **OR** LORazepam, ondansetron **OR** ondansetron (ZOFRAN) IV      Subjective:   Carle Fenech was seen and examined today.  No significant improvement since yesterday, remains lethargic, arousable but keeps his eyes closed and does not respond to any verbal commands.  Brother at the bedside.  1 BM yesterday, no fevers or chills.   Objective:   Vitals:  10/17/19 0500 10/17/19 0600 10/17/19 0700 10/17/19 0800  BP: 103/68 103/70 102/66   Pulse: 93 95 97   Resp: 14 (!) 24 18   Temp:    98.3 F (36.8 C)  TempSrc:    Oral  SpO2: 99% 99% 100%   Weight: 66.3 kg     Height:        Intake/Output Summary (Last 24 hours) at 10/17/2019 1110 Last data filed at 10/17/2019 0600 Gross per 24 hour  Intake 986.99 ml  Output 565 ml  Net 421.99 ml     Wt Readings from Last 3 Encounters:  10/17/19 66.3 kg  08/17/19 59 kg  05/07/15 74.4 kg   Physical Exam  General: Somnolent, marked jaundice, scleral icterus, not responding to any verbal commands  Cardiovascular: S1 S2 clear, RRR.  2+ pedal edema b/l  Respiratory: CTAB anteriorly  Gastrointestinal: Soft, nontender, distended, NBS  Ext: 2+ pedal edema bilaterally  Neuro: no new deficits  Musculoskeletal: No cyanosis, clubbing  Skin: No rashes  Psych: Somnolent    Data Reviewed:  I have personally reviewed following labs and imaging studies  Micro Results Recent Results (from the past 240 hour(s))  SARS Coronavirus 2 by RT PCR (hospital order, performed in [facility] hospital lab) Nasopharyngeal Nasopharyngeal Swab     Status: None   Collection Time: 10/15/19 12:21 AM   Specimen: Nasopharyngeal Swab  Result Value Ref Range Status   SARS Coronavirus 2 NEGATIVE NEGATIVE Final    Comment: (NOTE) SARS-CoV-2 target nucleic acids are NOT DETECTED. The SARS-CoV-2 RNA is  generally detectable in upper and lower respiratory specimens during the acute phase of infection. The lowest concentration of SARS-CoV-2 viral copies this assay can detect is 250 copies / mL. A negative result does not preclude SARS-CoV-2 infection and should not be used as the sole basis for treatment or other patient management decisions.  A negative result may occur with improper specimen collection / handling, submission of specimen other than nasopharyngeal swab, presence of viral mutation(s) within the areas targeted by this assay, and inadequate number of viral copies (<250 copies / mL). A negative result must be combined with clinical observations, patient history, and epidemiological information. Fact Sheet for Patients:   BoilerBrush.com.cy Fact Sheet for Healthcare Providers: https://pope.com/ This test is not yet approved or cleared  by the [place] FDA and has been authorized for detection and/or diagnosis of SARS-CoV-2 by FDA under an Emergency Use Authorization (EUA).  This EUA will remain in effect (meaning this test can be used) for the duration of the COVID-19 declaration under Section 564(b)(1) of the Act, 21 U.S.C. section 360bbb-3(b)(1), unless the authorization is terminated or revoked sooner. Performed at [facility], [address], [place], [place] [address]   Gram stain     Status: None   Collection Time: 10/15/19 10:32 AM   Specimen: Peritoneal Washings  Result Value Ref Range Status   Specimen Description PERITONEAL FLUID  Final   Special Requests NONE  Final   Gram Stain   Final    WBC PRESENT, PREDOMINANTLY MONONUCLEAR NO ORGANISMS SEEN CYTOSPIN SMEAR Performed at [facility], [address] [place], [place] [address]    Report Status 10/15/2019 FINAL  Final  Culture, body fluid-bottle     Status: None (Preliminary result)   Collection Time: 10/15/19 10:32 AM    Specimen: Peritoneal Washings  Result Value Ref Range Status   Specimen Description PERITONEAL FLUID  Final   Special Requests  NONE  Final   Culture   Final    NO GROWTH 1 DAY Performed at [facility], [place] [address] [place], [place] [address]    Report Status PENDING  Incomplete  MRSA PCR Screening     Status: None   Collection Time: 10/15/19  3:22 PM   Specimen: Nasopharyngeal  Result Value Ref Range Status   MRSA by PCR NEGATIVE NEGATIVE Final    Comment:        The GeneXpert MRSA Assay (FDA approved for NASAL specimens only), is one component of a comprehensive MRSA colonization surveillance program. It is not intended to diagnose MRSA infection nor to guide or monitor treatment for MRSA infections. Performed at [facility], [place] [address] [place], [place] [address]     Radiology Reports DG Abd 1 View  Result Date: 10/16/2019 CLINICAL DATA:  Check gastric catheter placement EXAM: ABDOMEN - 1 VIEW COMPARISON:  Film from earlier in the same day. FINDINGS: Gastric catheter has been advanced slightly into the stomach. Persistent small bowel dilatation is seen. No free air is noted. IMPRESSION: Gastric catheter within the stomach slightly deeper than that seen on the prior exam. Persistent small-bowel dilatation is seen. Electronically Signed   By: Inez Catalina M.D.   On: 10/16/2019 11:33   DG Abd 1 View  Result Date: 10/16/2019 CLINICAL DATA:  NG tube placement. EXAM: ABDOMEN - 1 VIEW COMPARISON:  CT, 10/14/2019. FINDINGS: NG tube tip projects in the proximal stomach. The side hole lies in the distal esophagus. Consider further insertion, approximately 10 cm, to allow the side-hole to fully into the stomach. IMPRESSION: NG tube with its tip projecting in the proximal stomach as detailed above. Electronically Signed   By: Lajean Manes M.D.   On: 10/16/2019 10:28   CT Head Wo Contrast  Result Date: 10/15/2019 CLINICAL DATA:  Recurrent falls, liver  failure, cirrhosis and hepatitis C EXAM: CT HEAD WITHOUT CONTRAST TECHNIQUE: Contiguous axial images were obtained from the base of the skull through the vertex without intravenous contrast. COMPARISON:  None. FINDINGS: Brain: No evidence of acute infarction, hemorrhage, hydrocephalus, extra-axial collection or mass lesion/mass effect. Symmetric prominence of the ventricles, cisterns and sulci compatible with parenchymal volume loss. Patchy areas of white matter hypoattenuation are most compatible with chronic microvascular angiopathy. Vascular: Atherosclerotic calcification of the carotid siphons. No hyperdense vessel. Skull: No calvarial fracture or suspicious osseous lesion. No scalp swelling or hematoma. Sinuses/Orbits: Paranasal sinuses and mastoid air cells are predominantly clear. Included orbital structures are unremarkable. Other: None IMPRESSION: 1. No acute intracranial abnormality. 2. Mild parenchymal volume loss and chronic microvascular angiopathy. Electronically Signed   By: Lovena Le M.D.   On: 10/15/2019 00:34   US Abdomen Limited  Result Date: 10/15/2019 CLINICAL DATA:  Jaundice. EXAM: ULTRASOUND ABDOMEN LIMITED RIGHT UPPER QUADRANT COMPARISON:  CT dated Oct 15, 2019 FINDINGS: Gallbladder: The gallbladder wall is thickened measuring approximately 6 mm. There are gallstones with gallbladder sludge. However, the sonographic Percell Miller sign is negative. Common bile duct: Diameter: 3 mm Liver: Diffuse increased echogenicity with slightly heterogeneous liver. Appearance typically secondary to fatty infiltration. Fibrosis secondary consideration. No secondary findings of cirrhosis noted. No focal hepatic lesion or intrahepatic biliary duct dilatation. The liver surface is nodular concerning for cirrhosis. Portal vein is patent on color Doppler imaging with normal direction of blood flow towards the liver. Other: There is a moderate to large volume of abdominal ascites. IMPRESSION: 1. Findings are  equivocal for acute  cholecystitis. While there is gallbladder wall thickening in the presence of cholelithiasis, the sonographic Eulah Pont sign is negative. Correlation with laboratory studies is recommended. Gallbladder wall thickening is a nonspecific finding can be seen in the presence of ascites. 2. Large volume abdominal ascites. 3. Hepatic steatosis. There is a nodular contour of the liver suggestive of underlying cirrhosis. Electronically Signed   By: Katherine Mantle M.D.   On: 10/15/2019 02:02   US Paracentesis  Result Date: 10/15/2019 INDICATION: Patient with a history of alcoholism and decompensated liver cirrhosis. Patient presented to the ED with altered mental status and ascites. Interventional radiology asked to perform a diagnostic and therapeutic paracentesis. EXAM: ULTRASOUND GUIDED right PARACENTESIS MEDICATIONS: 1% lidocaine 10 mL COMPLICATIONS: None immediate PROCEDURE: Informed written consent was obtained from the patient after a discussion of the risks, benefits and alternatives to treatment. A timeout was performed prior to the initiation of the procedure. Initial ultrasound scanning demonstrates a large amount of ascites within the right lower abdominal quadrant. The right lower abdomen was prepped and draped in the usual sterile fashion. 1% lidocaine was used for local anesthesia. Following this, a 19 gauge, 7-cm, Yueh catheter was introduced. An ultrasound image was saved for documentation purposes. The paracentesis was performed. The catheter was removed and a dressing was applied. The patient tolerated the procedure well without immediate post procedural complication. Patient received post-procedure intravenous albumin; see nursing notes for details. FINDINGS: A total of approximately 3750 mL of clear yellow fluid was removed. Samples were sent to the laboratory as requested by the clinical team. IMPRESSION: Successful ultrasound-guided paracentesis yielding 3750 mL of peritoneal  fluid. Read by: Alwyn Ren, NP Electronically Signed   By: Irish Lack M.D.   On: 10/15/2019 10:29   DG Chest Port 1 View  Result Date: 10/14/2019 CLINICAL DATA:  Hepatitis C, cirrhosis, altered level of consciousness EXAM: PORTABLE CHEST 1 VIEW COMPARISON:  08/17/2019 FINDINGS: The heart size and mediastinal contours are within normal limits. Both lungs are clear. The visualized skeletal structures are unremarkable. IMPRESSION: No active disease. Electronically Signed   By: Sharlet Salina M.D.   On: 10/14/2019 23:23   CT Renal Stone Study  Result Date: 10/15/2019 CLINICAL DATA:  Flank pain. Kidney stones suspected. EXAM: CT ABDOMEN AND PELVIS WITHOUT CONTRAST TECHNIQUE: Multidetector CT imaging of the abdomen and pelvis was performed following the standard protocol without IV contrast. COMPARISON:  None. FINDINGS: Lower chest: There are trace bilateral pleural effusions.The heart size is normal. The intracardiac blood pool is hypodense relative to the adjacent myocardium consistent with anemia. Hepatobiliary: There is severe diffuse hepatic steatosis. There is cholelithiasis. There may be some gallbladder wall thickening.There is no biliary ductal dilation. Pancreas: Normal contours without ductal dilatation. No peripancreatic fluid collection. Spleen: Unremarkable. Adrenals/Urinary Tract: --Adrenal glands: Unremarkable. --Right kidney/ureter: No hydronephrosis or radiopaque kidney stones. --Left kidney/ureter: No hydronephrosis or radiopaque kidney stones. --Urinary bladder: Unremarkable. Stomach/Bowel: --Stomach/Duodenum: There is a moderate-sized hiatal hernia. --Small bowel: Unremarkable. --Colon: Unremarkable. --Appendix: Normal. Vascular/Lymphatic: Atherosclerotic calcification is present within the non-aneurysmal abdominal aorta, without hemodynamically significant stenosis. --No retroperitoneal lymphadenopathy. --No mesenteric lymphadenopathy. --No pelvic or inguinal lymphadenopathy.  Reproductive: Unremarkable Other: There is a moderate to large volume of ascites. The abdominal wall is normal. Musculoskeletal. No acute displaced fractures. IMPRESSION: 1. No radiopaque kidney stones. 2. Severe diffuse hepatic steatosis. 3. Cholelithiasis with possible gallbladder wall thickening. If there is concern for acute cholecystitis, recommend further evaluation with right upper quadrant ultrasound. 4. Moderate to large volume of  ascites. 5. Trace bilateral pleural effusions. Aortic Atherosclerosis (ICD10-I70.0). Electronically Signed   By: Katherine Mantle M.D.   On: 10/15/2019 00:35    Lab Data:  CBC: Recent Labs  Lab 10/14/19 2155 10/15/19 0629 10/16/19 0626 10/16/19 1712 10/17/19 0228  WBC 11.6* 9.1 8.1  --  10.6*  NEUTROABS  --  7.0 6.0  --  9.3*  HGB 8.9* 8.4* 7.2* 9.4* 8.6*  HCT 25.3* 23.6* 20.8* 27.8* 25.3*  MCV 104.1* 104.9* 107.2*  --  103.3*  PLT 158 125* 96*  --  77*   Basic Metabolic Panel: Recent Labs  Lab 10/14/19 2155 10/14/19 2155 10/14/19 2330 10/15/19 0629 10/15/19 1911 10/16/19 0626 10/16/19 2033 10/17/19 0228  NA 130*  --   --  130*  --  131*  --  130*  K <2.0*   < >  --  2.3* 2.4* 2.8* 4.6 3.3*  CL 69*  --   --  75*  --  81*  --  85*  CO2 40*  --   --  36*  --  36*  --  32  GLUCOSE 84  --   --  78  --  76  --  113*  BUN 8  --   --  7  --  7  --  8  CREATININE 1.62*  --   --  1.41*  --  1.23  --  0.96  CALCIUM 8.1*  --   --  7.2*  --  7.9*  --  7.6*  MG  --   --  1.2* 1.6* 2.8* 2.9*  --  2.7*  PHOS  --   --   --  3.0  --   --   --   --    < > = values in this interval not displayed.   GFR: Estimated Creatinine Clearance: 82.5 mL/min (by C-G formula based on SCr of 0.96 mg/dL). Liver Function Tests: Recent Labs  Lab 10/14/19 2155 10/15/19 0629 10/16/19 0626 10/17/19 0228  AST 102* 270* 198* 150*  ALT 76* 65* 56* 51*  ALKPHOS 201* 167* 134* 114  BILITOT 22.3* 19.4* 22.3* 24.5*  PROT 6.0* 4.9* 5.7* 5.1*  ALBUMIN 1.8* 1.5* 2.9*  2.7*   Recent Labs  Lab 10/14/19 2155  LIPASE 66*   Recent Labs  Lab 10/14/19 2330 10/16/19 0905 10/16/19 1715 10/17/19 0228  AMMONIA 81* 59* 48* 59*   Coagulation Profile: Recent Labs  Lab 10/14/19 2330 10/16/19 0626 10/17/19 0841  INR 1.7* 1.8* 1.7*   Cardiac Enzymes: No results for input(s): CKTOTAL, CKMB, CKMBINDEX, TROPONINI in the last 168 hours. BNP (last 3 results) No results for input(s): PROBNP in the last 8760 hours. HbA1C: No results for input(s): HGBA1C in the last 72 hours. CBG: No results for input(s): GLUCAP in the last 168 hours. Lipid Profile: No results for input(s): CHOL, HDL, LDLCALC, TRIG, CHOLHDL, LDLDIRECT in the last 72 hours. Thyroid Function Tests: Recent Labs    10/15/19 0319 10/15/19 1240  TSH 0.071*  --   FREET4  --  1.83*   Anemia Panel: Recent Labs    10/15/19 0319  VITAMINB12 1,238*  FOLATE 18.0  FERRITIN 2,394*  TIBC 91*  IRON 102  RETICCTPCT 3.9*   Urine analysis: No results found for: COLORURINE, APPEARANCEUR, LABSPEC, PHURINE, GLUCOSEU, HGBUR, BILIRUBINUR, KETONESUR, PROTEINUR, UROBILINOGEN, NITRITE, LEUKOCYTESUR   Iyana Topor M.D. [facility] 10/17/2019, 11:10 AM   Call night coverage person covering after 7pm

## 2019-10-18 DIAGNOSIS — Z515 Encounter for palliative care: Secondary | ICD-10-CM

## 2019-10-18 DIAGNOSIS — D696 Thrombocytopenia, unspecified: Secondary | ICD-10-CM

## 2019-10-18 DIAGNOSIS — E43 Unspecified severe protein-calorie malnutrition: Secondary | ICD-10-CM

## 2019-10-18 DIAGNOSIS — K7460 Unspecified cirrhosis of liver: Secondary | ICD-10-CM

## 2019-10-18 DIAGNOSIS — R531 Weakness: Secondary | ICD-10-CM

## 2019-10-18 DIAGNOSIS — K7200 Acute and subacute hepatic failure without coma: Secondary | ICD-10-CM

## 2019-10-18 DIAGNOSIS — Z7189 Other specified counseling: Secondary | ICD-10-CM

## 2019-10-18 DIAGNOSIS — K7290 Hepatic failure, unspecified without coma: Secondary | ICD-10-CM

## 2019-10-18 LAB — CBC WITH DIFFERENTIAL/PLATELET
Abs Immature Granulocytes: 0.24 10*3/uL — ABNORMAL HIGH (ref 0.00–0.07)
Basophils Absolute: 0 10*3/uL (ref 0.0–0.1)
Basophils Relative: 0 %
Eosinophils Absolute: 0 10*3/uL (ref 0.0–0.5)
Eosinophils Relative: 0 %
HCT: 25.8 % — ABNORMAL LOW (ref 39.0–52.0)
Hemoglobin: 8.6 g/dL — ABNORMAL LOW (ref 13.0–17.0)
Immature Granulocytes: 2 %
Lymphocytes Relative: 10 %
Lymphs Abs: 1.3 10*3/uL (ref 0.7–4.0)
MCH: 34.5 pg — ABNORMAL HIGH (ref 26.0–34.0)
MCHC: 33.3 g/dL (ref 30.0–36.0)
MCV: 103.6 fL — ABNORMAL HIGH (ref 80.0–100.0)
Monocytes Absolute: 1.6 10*3/uL — ABNORMAL HIGH (ref 0.1–1.0)
Monocytes Relative: 12 %
Neutro Abs: 10 10*3/uL — ABNORMAL HIGH (ref 1.7–7.7)
Neutrophils Relative %: 76 %
Platelets: 101 10*3/uL — ABNORMAL LOW (ref 150–400)
RBC: 2.49 MIL/uL — ABNORMAL LOW (ref 4.22–5.81)
RDW: 18 % — ABNORMAL HIGH (ref 11.5–15.5)
WBC: 13.3 10*3/uL — ABNORMAL HIGH (ref 4.0–10.5)
nRBC: 0 % (ref 0.0–0.2)

## 2019-10-18 LAB — COMPREHENSIVE METABOLIC PANEL
ALT: 50 U/L — ABNORMAL HIGH (ref 0–44)
AST: 132 U/L — ABNORMAL HIGH (ref 15–41)
Albumin: 2.5 g/dL — ABNORMAL LOW (ref 3.5–5.0)
Alkaline Phosphatase: 122 U/L (ref 38–126)
Anion gap: 17 — ABNORMAL HIGH (ref 5–15)
BUN: 9 mg/dL (ref 6–20)
CO2: 30 mmol/L (ref 22–32)
Calcium: 7.9 mg/dL — ABNORMAL LOW (ref 8.9–10.3)
Chloride: 86 mmol/L — ABNORMAL LOW (ref 98–111)
Creatinine, Ser: 1.05 mg/dL (ref 0.61–1.24)
GFR calc Af Amer: >60 mL/min (ref >60)
GFR calc non Af Amer: >60 mL/min (ref >60)
Glucose, Bld: 99 mg/dL (ref 70–99)
Potassium: 2.8 mmol/L — ABNORMAL LOW (ref 3.5–5.1)
Sodium: 133 mmol/L — ABNORMAL LOW (ref 135–145)
Total Bilirubin: 25.3 mg/dL (ref 0.3–1.2)
Total Protein: 5.2 g/dL — ABNORMAL LOW (ref 6.5–8.1)

## 2019-10-18 LAB — MAGNESIUM: Magnesium: 2.7 mg/dL — ABNORMAL HIGH (ref 1.7–2.4)

## 2019-10-18 LAB — POTASSIUM: Potassium: 3 mmol/L — ABNORMAL LOW (ref 3.5–5.1)

## 2019-10-18 LAB — PROTIME-INR
INR: 1.7 — ABNORMAL HIGH (ref 0.8–1.2)
Prothrombin Time: 19.1 seconds — ABNORMAL HIGH (ref 11.4–15.2)

## 2019-10-18 MED ORDER — LORAZEPAM 1 MG PO TABS: 1.0000 mg | ORAL_TABLET | ORAL | Status: DC | PRN

## 2019-10-18 MED ORDER — POTASSIUM CHLORIDE 10 MEQ/100ML IV SOLN
10.0000 meq | INTRAVENOUS | Status: AC
  Administered 2019-10-18 (×2): 10 meq via INTRAVENOUS
  Filled 2019-10-18 (×2): qty 100

## 2019-10-18 MED ORDER — POTASSIUM CHLORIDE 10 MEQ/100ML IV SOLN
INTRAVENOUS | Status: AC
  Administered 2019-10-18: 10 meq via INTRAVENOUS
  Filled 2019-10-18: qty 100

## 2019-10-18 MED ORDER — POTASSIUM CHLORIDE 10 MEQ/100ML IV SOLN
10.0000 meq | INTRAVENOUS | Status: AC
  Administered 2019-10-18 (×4): 10 meq via INTRAVENOUS
  Filled 2019-10-18 (×4): qty 100

## 2019-10-18 MED ORDER — LORAZEPAM 2 MG/ML IJ SOLN: 1.0000 mg | INTRAMUSCULAR | Status: DC | PRN

## 2019-10-18 MED ORDER — POTASSIUM CHLORIDE 10 MEQ/100ML IV SOLN: 10.0000 meq | INTRAVENOUS | Status: DC

## 2019-10-18 MED ORDER — SPIRONOLACTONE 25 MG PO TABS
25.0000 mg | ORAL_TABLET | Freq: Every day | ORAL | Status: DC
  Administered 2019-10-18: 25 mg
  Filled 2019-10-18 (×2): qty 1

## 2019-10-18 MED ORDER — FUROSEMIDE 20 MG PO TABS
20.0000 mg | ORAL_TABLET | Freq: Every day | ORAL | Status: DC
  Administered 2019-10-18: 20 mg
  Filled 2019-10-18 (×2): qty 1

## 2019-10-18 MED ORDER — LORAZEPAM 2 MG/ML IJ SOLN: 0.0000 mg | Freq: Three times a day (TID) | INTRAMUSCULAR | Status: DC

## 2019-10-18 NOTE — Progress Notes (Signed)
[name] Gastroenterology Progress Note  Corey Fernandez 54 y.o. [birth year]  CC: Cirrhosis, encephalopathy, abnormal LFTs   Subjective: Patient seen and examined at bedside.  Remains lethargic.  No significant changes compared to yesterday.  sister at bedside.     ROS : Not able to obtain   Objective: Vital signs in last 24 hours: Vitals:   10/18/19 0800 10/18/19 0900  BP: 113/80 112/76  Pulse: (!) 105 (!) 106  Resp: 18 20  Temp: 98.7 F (37.1 C)   SpO2: 93% 93%    Physical Exam:  General.  Patient lethargic and  sleepy.  Not able to give any history.   Appears deeply jaundiced. Abdomen mildly distended.  Soft, bowel sounds present.  No peritoneal signs No lower extremity edema  Lab Results: Recent Labs    10/17/19 0228 10/18/19 0243  NA 130* 133*  K 3.3* 2.8*  CL 85* 86*  CO2 32 30  GLUCOSE 113* 99  BUN 8 9  CREATININE 0.96 1.05  CALCIUM 7.6* 7.9*  MG 2.7* 2.7*   Recent Labs    10/17/19 0228 10/18/19 0243  AST 150* 132*  ALT 51* 50*  ALKPHOS 114 122  BILITOT 24.5* 25.3*  PROT 5.1* 5.2*  ALBUMIN 2.7* 2.5*   Recent Labs    10/17/19 0228 10/18/19 0243  WBC 10.6* 13.3*  NEUTROABS 9.3* 10.0*  HGB 8.6* 8.6*  HCT 25.3* 25.8*  MCV 103.3* 103.6*  PLT 77* 101*   Recent Labs    10/17/19 0841 10/18/19 0243  LABPROT 19.5* 19.1*  INR 1.7* 1.7*      Assessment/Plan: -Jaundice with total bilirubin of 22.3 on admission.  AST 102, ALT 76 and alkaline phosphatase 201.  Most likely decompensated cirrhosis versus alcoholic hepatitis.  Discriminant function score of 40.2. MELD 32 as of 05/23, discriminant function score of 45 05/25  -Hepatic encephalopathy.  -History of hepatitis C. hepatitis panel and HCV PCR negative. -Ongoing alcohol use -Ascites -Hepatic encephalopathy.  He has been on lactulose since 2012 according to brother. -Electrolyte imbalance -management per primary team  Recommendations ------------------------- -Family is considering  comfort care/hospice. -Long discussion with patient's sister at bedside.   -No improvement in LFTs with prednisolone. -Continue supportive care for now.  GI will follow periodically if patient remains in the hospital.   Kathi Der MD, FACP 10/18/2019, 10:00 AM  Contact #  [phone]

## 2019-10-18 NOTE — Progress Notes (Addendum)
PROGRESS NOTE  Corey Fernandez HLK:[id number] DOB: 05/02/1966 DOA: 10/14/2019 PCP: Lucia Gaskins, MD  Brief History   54 year old man PMH alcohol abuse, hepatitis C admitted for acute hepatic encephalopathy, decompensated liver cirrhosis with ascites, alcoholic hepatitis  A & P  Acute hepatic encephalopathy, decompensated liver cirrhosis with ascites, hyponatremia, thrombocytopenia, macrocytic anemia, alcoholic hepatitis. MELD 31 on admission.  Status post paracentesis 5/24. --MELD 27.  Remains encephalopathic, appears critically ill, will ontinue lactulose via NG tube secondary to mental status, continue rifaximin.  Goal 2-3 bowel movements per day. --Continue prednisolone, Lasix and spironolactone per gastroenterology  Alcohol abuse --Monitor for withdrawal.  Hypokalemia --Replete.  Magnesium within normal limits.  Hypothyroidism on levothyroxine as an outpatient --TSH was low, Synthroid decreased.  No clinical features of hyperthyroidism.  Severe protein calorie malnutrition  Acute kidney injury, resolved with treatment  Disposition Plan:  From: Home Anticipated disposition: Residential hospice Discussion: Critically ill 55 year old man with alcoholic hepatitis, superimposed decompensated cirrhosis, with metabolic encephalopathy, poor responsiveness, appears to be end-stage.  Hospice appears to be appropriate.  Discussed with sister at bedside.  Patient unmarried, no children.  She has been in contact with her 2 brothers and with their mother.  All family involved and on the same page.  Discussed with sister and Dr. Rowe Pavy together, recommendations continue current treatment today, consider transition to hospice residential tomorrow.  I discussed separately with daughter as well, and reviewed current clinical condition.  Imaging abnormalities most consistent with ascites and cirrhosis. No clinical features of cholecystitis.  No clinical features of hyperthyroidism.  Unfortunately able  to offer more, continue current treatment today, will assess for any significant clinical improvement, residential hospice tomorrow.  I discussed in detail with bedside RN.  Status is: Inpatient  Remains inpatient appropriate because:Altered mental status   Dispo: The patient is from: Home              Anticipated d/c is to: Residential hospice              Anticipated d/c date is: 1-2 days              Patient currently is not medically stable to d/c.  DVT prophylaxis: SCDs Code Status: DNR Family Communication: sister at bedside    Murray Hodgkins, MD  [facility] Direct contact: see [email or web address] (further directions at bottom of note if needed) 7PM-7AM contact night coverage as at bottom of note 10/18/2019, 9:42 AM  LOS: 3 days   Significant Hospital Events   . 5/23 admitted for decompensated cirrhosis   Consults:  . Gastroenterology   Procedures:  . 5/24 paracentesis with removal of 3.5 L . 5/25 transfusion 1 unit PRBCs  Significant Diagnostic Tests:  Marland Kitchen    Micro Data:   Peritoneal fluid gram stain negative, cultures no growth to date   Antimicrobials:  .   Interval History/Subjective  Discussed with nursing.  Poorly responsive.  Foul-smelling. Discussed with sister at bedside.  "He looks like my father did when he died, he died of the same thing."  Objective   Vitals:  Vitals:   10/18/19 0800 10/18/19 0900  BP: 113/80 112/76  Pulse: (!) 105 (!) 106  Resp: 18 20  Temp: 98.7 F (37.1 C)   SpO2: 93% 93%    Exam:  Constitutional.  Appears mildly restless, uncomfortable, critically ill Eyes.  Did not open. Head.  Temporal wasting noted bilaterally. ENT.  ET tube in place. Respiratory.  Clear to auscultation bilaterally.  No wheezes, rales or  rhonchi.  Normal respiratory effort. Cardiovascular.  Regular rate and rhythm.  No murmur, rub or gallop.  No lower extremity edema.  Dorsalis pedis pulse 2+ bilaterally. Abdomen.  Distended, tympanic, soft,  nontender Skin.  Jaundice noted. Musculoskeletal.  Does not comply with examination.  Tone appears grossly normal. Psychiatric.  Not assessable.  Will respond very briefly to questions, not appropriately.   I have personally reviewed the following:   Today's Data  . Sodium 133, potassium 3.8, creatinine within normal limits. . Magnesium 2.7, AST 132 trending down, ALT stable at 50 . Ammonia was 59 yesterday, total bilirubin 25.3 today . Platelets stable at 101.  Hemoglobin stable at 8.6.  WBC slightly higher at 13.3. . INR stable at 1.7.  Scheduled Meds: . Chlorhexidine Gluconate Cloth  6 each Topical Daily  . folic acid  1 mg Per Tube Daily  . furosemide  20 mg Per Tube Daily  . lactulose  30 g Per Tube TID  . levothyroxine  100 mcg Per Tube Q0600  . LORazepam  0-4 mg Intravenous Q8H  . multivitamin  15 mL Per Tube Daily  . pantoprazole sodium  40 mg Per Tube Daily  . prednisoLONE  40 mg Per Tube QAC breakfast  . rifaximin  550 mg Per Tube BID  . sodium chloride flush  3 mL Intravenous Q12H  . spironolactone  25 mg Per Tube Daily  . thiamine  100 mg Per Tube Daily   Or  . thiamine  100 mg Intravenous Daily   Continuous Infusions: . albumin human    . potassium chloride 10 mEq (10/18/19 0756)    Principal Problem:   Acute hepatic encephalopathy Active Problems:   Decompensated hepatic cirrhosis (HCC)   Hypothyroidism   Hypokalemia   Hypomagnesemia   AKI (acute kidney injury) (HCC)   Hyponatremia   Macrocytic anemia   Coagulopathy (HCC)   Pressure injury of skin   Thrombocytopenia (HCC)   Severe protein-calorie malnutrition (HCC)   LOS: 3 days   How to contact the [facility] Attending or Consulting provider 7A - 7P or covering provider during after hours 7P -7A, for this patient?  1. Check the care team in [facility] and look for a) attending/consulting TRH provider listed and b) the [facility] team listed 2. Log into [email or web address] and use [place]'s universal password to access.  If you do not have the password, please contact the hospital operator. 3. Locate the [facility] provider you are looking for under [facility] and page to a number that you can be directly reached. 4. If you still have difficulty reaching the provider, please page the [facility] (Director on Call) for the Hospitalists listed on amion for assistance.

## 2019-10-18 NOTE — Consult Note (Signed)
Consultation Note Date: 10/18/2019   Patient Name: Corey Fernandez  DOB: [birth year]  MRN: [id number]  Age / Sex: 54 y.o., male  PCP: Oval Linsey, MD Referring Physician: Standley Brooking, MD  Reason for Consultation: Establishing goals of care  HPI/Patient Profile: 54 y.o. male  with past medical history of hepatitis C, alcohol use admitted on 10/14/2019 with jaundice, elevated bilirubin, high meld score, hepatic encephalopathy, electrolyte imbalances.   Clinical Assessment and Goals of Care: Patient remains admitted to hospital medicine service in stepdown unit at [facility] in [place], [place] with gastroenterology colleagues also following.  He remains admitted for acute hepatic encephalopathy, decompensated liver cirrhosis with ascites, hyponatremia, thrombocytopenia and macrocytic anemia.  High suspicion for alcoholic hepatitis, severe protein calorie malnutrition.  Acute kidney injury was also present earlier in this hospitalization.  Patient remains lethargic, remains jaundiced.  Patient has NG tube and lactulose is being administered.  Also on rifaximin.  Also on steroids and diuretics per GI service.  Has also undergone paracentesis earlier in this hospitalization.  Given the nature of patient's serious illness and high likelihood for ongoing decline and decompensation, palliative consult has been requested for ongoing goals of care discussions and to provide additional support to patient and family.  Patient is resting in bed.  He appears icteric.  He winces grimaces some but does not open eyes does not interact does not verbalize.  Sister present at the bedside.  Introduced myself and palliative care as follows:  Palliative medicine is specialized medical care for people living with serious illness. It focuses on providing relief from the symptoms and stress of a serious illness.  The goal is to improve quality of life for both the patient and the family.  Goals of care: Broad aims of medical therapy in relation to the patient's values and preferences. Our aim is to provide medical care aimed at enabling patients to achieve the goals that matter most to them, given the circumstances of their particular medical situation and their constraints.   Discussed with patient's sister about patient's underlying serious illness as well as nature of current hospitalization.  Discussed with her about most recent labs and imaging as well as current attempts towards stabilization.  Discussed about patient's serious illness being irreversible at this point.  Patient's 92-month mortality risk based on his meld score is 52.6%.  This was explained in detail.  Decline symptoms from liver failure discussed in detail.  End-of-life signs and symptoms also briefly touched upon.  Introduced hospice philosophy of care.  Discussed about various levels of hospice.  Specifically, the type of care that can be provided inside a residential hospice setting was introduced and discussed in detail.  See below.  HCPOA Siblings and mother.  Sister at bedside.  SUMMARY OF RECOMMENDATIONS   Agree with DNR Continue current mode of care as a time-limited trial of current interventions for the next 24 hours. Family meeting: Patient's family includes his siblings and mother.  Patient does not have a spouse does not  have children.  Patient was in the [employer] and was part of operation [employer].  Acknowledged his service to this country to his sister and thanked him for his service.  Discussed about residential hospice: family to discuss amongst themselves today possibly mother will try to come from [place] today to see the patient. PMT to follow-up. Code Status/Advance Care Planning:  DNR    Symptom Management:    as above.   Palliative Prophylaxis:   Delirium Protocol   Psycho-social/Spiritual:    Desire for further Chaplaincy support:yes  Additional Recommendations: Education on Hospice  Prognosis:   < 2 weeks  Discharge Planning: To Be Determined      Primary Diagnoses: Present on Admission: . Acute hepatic encephalopathy . Decompensated hepatic cirrhosis (HCC) . Hypothyroidism . Hypokalemia . Hypomagnesemia . AKI (acute kidney injury) (HCC) . Hyponatremia . Macrocytic anemia . Coagulopathy (HCC)   I have reviewed the medical record, interviewed the patient and family, and examined the patient. The following aspects are pertinent.  Past Medical History:  Diagnosis Date  . Hypertension   . Hypothyroidism    Social History   Socioeconomic History  . Marital status: Single    Spouse name: Not on file  . Number of children: Not on file  . Years of education: Not on file  . Highest education level: Not on file  Occupational History  . Not on file  Tobacco Use  . Smoking status: Former Smoker    Packs/day: 1.00    Years: 20.00    Pack years: 20.00    Types: Cigarettes  . Smokeless tobacco: Never Used  Substance and Sexual Activity  . Alcohol use: No  . Drug use: No  . Sexual activity: Not on file  Other Topics Concern  . Not on file  Social History Narrative  . Not on file   Social Determinants of Health   Financial Resource Strain:   . Difficulty of Paying Living Expenses:   Food Insecurity:   . Worried About Programme researcher, broadcasting/film/video in the Last Year:   . Barista in the Last Year:   Transportation Needs:   . Freight forwarder (Medical):   Marland Kitchen Lack of Transportation (Non-Medical):   Physical Activity:   . Days of Exercise per Week:   . Minutes of Exercise per Session:   Stress:   . Feeling of Stress :   Social Connections:   . Frequency of Communication with Friends and Family:   . Frequency of Social Gatherings with Friends and Family:   . Attends Religious Services:   . Active Member of Clubs or Organizations:   . Attends  Banker Meetings:   Marland Kitchen Marital Status:    History reviewed. No pertinent family history. Scheduled Meds: . Chlorhexidine Gluconate Cloth  6 each Topical Daily  . folic acid  1 mg Per Tube Daily  . furosemide  20 mg Per Tube Daily  . lactulose  30 g Per Tube TID  . levothyroxine  100 mcg Per Tube Q0600  . LORazepam  0-4 mg Intravenous Q8H  . multivitamin  15 mL Per Tube Daily  . pantoprazole sodium  40 mg Per Tube Daily  . prednisoLONE  40 mg Per Tube QAC breakfast  . rifaximin  550 mg Per Tube BID  . sodium chloride flush  3 mL Intravenous Q12H  . spironolactone  25 mg Per Tube Daily  . thiamine  100 mg Per Tube Daily   Or  .  thiamine  100 mg Intravenous Daily   Continuous Infusions: . albumin human    . potassium chloride 10 mEq (10/18/19 0756)   PRN Meds:.albumin human, ondansetron **OR** ondansetron (ZOFRAN) IV Medications Prior to Admission:  Prior to Admission medications   Medication Sig Start Date End Date Taking? Authorizing Provider  amLODipine (NORVASC) 5 MG tablet Take 5 mg by mouth daily.   Yes [provider]  Cholecalciferol (VITAMIN D3) 1.25 MG (50000 UT) CAPS Take 1 capsule by mouth once a week. 08/01/19  Yes [provider]  folic acid (FOLVITE) 1 MG tablet Take 1 mg by mouth daily. 10/05/19  Yes [provider]  lactulose (CHRONULAC) 10 GM/15ML solution Take 10 g by mouth daily.   Yes [provider]  levothyroxine (SYNTHROID, LEVOTHROID) 125 MCG tablet Take 125 mcg by mouth daily before breakfast.   Yes [provider]  metoprolol succinate (TOPROL-XL) 25 MG 24 hr tablet Take 25 mg by mouth daily. 08/02/19  Yes [provider]  Multiple Vitamins-Minerals (MULTIVITAMINS THER. W/MINERALS) TABS tablet Take 1 tablet by mouth daily.   Yes [provider]  pantoprazole (PROTONIX) 40 MG tablet Take 40 mg by mouth daily. 10/05/19  Yes [provider]  spironolactone (ALDACTONE) 25 MG tablet  Take 12.5 mg by mouth daily. 07/30/19  Yes [provider]   No Known Allergies Review of Systems Non verbal  Physical Exam Appears chronically ill Muscle wasting evident Abdomen is distended but soft S1-S2 Has diffuse edema Appears  icteric Vital Signs: BP 112/76   Pulse (!) 106   Temp 98.7 F (37.1 C) (Oral)   Resp 20   Ht 5\' 9"  (1.753 m)   Wt 63.4 kg   SpO2 93%   BMI 20.64 kg/m  Pain Scale: CPOT   Pain Score: Asleep   SpO2: SpO2: 93 % O2 Device:SpO2: 93 % O2 Flow Rate: .O2 Flow Rate (L/min): 1 L/min  IO: Intake/output summary:   Intake/Output Summary (Last 24 hours) at 10/18/2019 0958 Last data filed at 10/18/2019 0700 Gross per 24 hour  Intake 50 ml  Output 225 ml  Net -175 ml    LBM: Last BM Date: 10/16/19 Baseline Weight: Weight: 58 kg Most recent weight: Weight: 63.4 kg     Palliative Assessment/Data:   PPS 20%  Time In: 9  Time Out:  10.10 Time Total:   70 min.  Greater than 50%  of this time was spent counseling and coordinating care related to the above assessment and plan.  Signed by: Loistine Chance, MD   Please contact Palliative Medicine Team phone at [phone] for questions and concerns.  For individual provider: See Shea Evans

## 2019-10-18 NOTE — TOC Progression Note (Signed)
Transition of Care [facility]) - Progression Note    Patient Details  Name: Corey Fernandez MRN: [id number] Date of Birth: [birth year]  [facility] CM/SW Contact  Golda Acre, RN Phone Number: 10/18/2019, 8:50 AM  Clinical Narrative:    Pt with hx of etoh abuse and continued ingestion. Hx of cirrhosis, iv ativan,iv albumin, na=133, k+=2.8, total bilirubin -25.3, wbc 13.3 hgb 8.6. Plan is for patient to go snf once stable. Brother is planning on becoming poa  Expected Discharge Plan: Skilled Nursing Facility Barriers to Discharge: Continued Medical Work up  Expected Discharge Plan and Services Expected Discharge Plan: Skilled Nursing Facility   Discharge Planning Services: CM Consult Post Acute Care Choice: Skilled Nursing Facility Living arrangements for the past 2 months: Single Family Home                                       Social Determinants of Health (SDOH) Interventions    Readmission Risk Interventions No flowsheet data found.

## 2019-10-18 NOTE — Progress Notes (Signed)
MD placed made aware - see orders

## 2019-10-19 DIAGNOSIS — E43 Unspecified severe protein-calorie malnutrition: Secondary | ICD-10-CM

## 2019-10-19 DIAGNOSIS — Z7189 Other specified counseling: Secondary | ICD-10-CM

## 2019-10-19 DIAGNOSIS — R531 Weakness: Secondary | ICD-10-CM

## 2019-10-19 DIAGNOSIS — Z515 Encounter for palliative care: Secondary | ICD-10-CM

## 2019-10-19 DIAGNOSIS — R17 Unspecified jaundice: Secondary | ICD-10-CM

## 2019-10-19 LAB — COMPREHENSIVE METABOLIC PANEL
ALT: 51 U/L — ABNORMAL HIGH (ref 0–44)
AST: 125 U/L — ABNORMAL HIGH (ref 15–41)
Albumin: 2.4 g/dL — ABNORMAL LOW (ref 3.5–5.0)
Alkaline Phosphatase: 128 U/L — ABNORMAL HIGH (ref 38–126)
Anion gap: 10 (ref 5–15)
BUN: 9 mg/dL (ref 6–20)
CO2: 32 mmol/L (ref 22–32)
Calcium: 8.3 mg/dL — ABNORMAL LOW (ref 8.9–10.3)
Chloride: 88 mmol/L — ABNORMAL LOW (ref 98–111)
Creatinine, Ser: 0.81 mg/dL (ref 0.61–1.24)
GFR calc Af Amer: >60 mL/min (ref >60)
GFR calc non Af Amer: >60 mL/min (ref >60)
Glucose, Bld: 113 mg/dL — ABNORMAL HIGH (ref 70–99)
Potassium: 4 mmol/L (ref 3.5–5.1)
Sodium: 130 mmol/L — ABNORMAL LOW (ref 135–145)
Total Bilirubin: 27.7 mg/dL (ref 0.3–1.2)
Total Protein: 5.3 g/dL — ABNORMAL LOW (ref 6.5–8.1)

## 2019-10-19 LAB — PROTIME-INR
INR: 1.7 — ABNORMAL HIGH (ref 0.8–1.2)
Prothrombin Time: 18.9 s — ABNORMAL HIGH (ref 11.4–15.2)

## 2019-10-19 NOTE — TOC Transition Note (Signed)
Transition of Care [facility]) - CM/SW Discharge Note   Patient Details  Name: Mayco Walrond MRN: [id number] Date of Birth: Dec 19, 1965  Transition of [facility]) CM/SW Contact:  Elliot Gault, LCSW Phone Number: 10/19/2019, 1:30 PM   Clinical Narrative:     Pt accepted for residential hospice at [facility] today. Family aware and in agreement. Updated RN who will call report. Arranged PTAR to transport.   There are no other TOC needs for dc.  Final next level of care: Hospice Medical Facility Barriers to Discharge: Barriers Resolved   Patient Goals and CMS Choice Patient states their goals for this hospitalization and ongoing recovery are:: not stated due to ams   Choice offered to / list presented to : Sibling, [facility] POA / Guardian  Discharge Placement                Patient to be transferred to facility by: PTAR Name of family member notified: Shawn Patient and family notified of of transfer: 10/19/19  Discharge Plan and Services   Discharge Planning Services: CM Consult Post Acute Care Choice: Skilled Nursing Facility                               Social Determinants of Health (SDOH) Interventions     Readmission Risk Interventions No flowsheet data found.

## 2019-10-19 NOTE — Progress Notes (Signed)
Report given to hospice facility.

## 2019-10-19 NOTE — Discharge Summary (Signed)
Physician Discharge Summary  Corey Fernandez CZY:[id number] DOB: 06-09-1965 DOA: 10/14/2019  PCP: Oval Linsey, MD  Admit date: 10/14/2019 Discharge date: 10/19/2019  Transferring to residential hospice.    Discharge Diagnoses: Principal diagnosis is #1 1. Acute hepatic encephalopathy, decompensated liver cirrhosis with ascites, hyponatremia, thrombocytopenia, macrocytic anemia 2. Alcoholic hepatitis. 3. Alcohol abuse 4. Hypothyroidism  5. Severe protein calorie malnutrition 6. Acute kidney injury, resolved with treatment   Discharge Condition: improved Disposition: residential hospice  Diet recommendation: as desired  Filed Weights   10/17/19 0500 10/18/19 0500 10/19/19 0500  Weight: 66.3 kg 63.4 kg 63.6 kg    History of present illness:  54 year old man PMH alcohol abuse, hepatitis C admitted for acute hepatic encephalopathy, decompensated liver cirrhosis with ascites, alcoholic hepatitis  Hospital Course:  Patient was seen by gastroenterology and treated aggressively for decompensated cirrhosis and alcoholic hepatitis.  Despite aggressive treatment he failed to improve, in conference with gastroenterology, palliative medicine, family, decision made by family and supported by medical team to pursue full comfort care and transition to residential hospice.  Acute hepatic encephalopathy, decompensated liver cirrhosis with ascites, hyponatremia, thrombocytopenia, macrocytic anemia, alcoholic hepatitis. MELD 31 on admission.  Status post paracentesis 5/24. --Remains encephalopathic, total bilirubin worse, no significant improvement noted. --Discussed with sister at bedside, all family members including mother and two brothers request full comfort care and transition to residential hospice.  Alcohol abuse --No evidence of withdrawal  Hypokalemia --Resolved  Hypothyroidism on levothyroxine as an outpatient --TSH was low, Synthroid stopped now.  No clinical features of  hyperthyroidism. --No further evaluation suggested  Severe protein calorie malnutrition  Acute kidney injury, resolved with treatment  No evidence of cholecystitis or infection.  Significant Hospital Events    5/23 admitted for decompensated cirrhosis  Consults:   Gastroenterology  Procedures:   5/24 paracentesis with removal of 3.5 L  5/25 transfusion 1 unit PRBCs  Significant Diagnostic Tests:     Micro Data:   Peritoneal fluid gram stain negative, cultures no growth to date .   Today's assessment: See progress note same day.   Discharge Instructions   Allergies as of 10/19/2019   No Known Allergies     Medication List    STOP taking these medications   amLODipine 5 MG tablet Commonly known as: NORVASC   folic acid 1 MG tablet Commonly known as: FOLVITE   levothyroxine 125 MCG tablet Commonly known as: SYNTHROID   metoprolol succinate 25 MG 24 hr tablet Commonly known as: TOPROL-XL   multivitamins ther. w/minerals Tabs tablet   spironolactone 25 MG tablet Commonly known as: ALDACTONE   Vitamin D3 1.25 MG (50000 UT) Caps     TAKE these medications   lactulose 10 GM/15ML solution Commonly known as: CHRONULAC Take 10 g by mouth daily.   pantoprazole 40 MG tablet Commonly known as: PROTONIX Take 40 mg by mouth daily.      No Known Allergies  The results of significant diagnostics from this hospitalization (including imaging, microbiology, ancillary and laboratory) are listed below for reference.    Significant Diagnostic Studies: DG Abd 1 View  Result Date: 10/16/2019 CLINICAL DATA:  Check gastric catheter placement EXAM: ABDOMEN - 1 VIEW COMPARISON:  Film from earlier in the same day. FINDINGS: Gastric catheter has been advanced slightly into the stomach. Persistent small bowel dilatation is seen. No free air is noted. IMPRESSION: Gastric catheter within the stomach slightly deeper than that seen on the prior exam. Persistent  small-bowel dilatation is seen. Electronically Signed  By: Inez Catalina M.D.   On: 10/16/2019 11:33   DG Abd 1 View  Result Date: 10/16/2019 CLINICAL DATA:  NG tube placement. EXAM: ABDOMEN - 1 VIEW COMPARISON:  CT, 10/14/2019. FINDINGS: NG tube tip projects in the proximal stomach. The side hole lies in the distal esophagus. Consider further insertion, approximately 10 cm, to allow the side-hole to fully into the stomach. IMPRESSION: NG tube with its tip projecting in the proximal stomach as detailed above. Electronically Signed   By: Lajean Manes M.D.   On: 10/16/2019 10:28   CT Head Wo Contrast  Result Date: 10/15/2019 CLINICAL DATA:  Recurrent falls, liver failure, cirrhosis and hepatitis C EXAM: CT HEAD WITHOUT CONTRAST TECHNIQUE: Contiguous axial images were obtained from the base of the skull through the vertex without intravenous contrast. COMPARISON:  None. FINDINGS: Brain: No evidence of acute infarction, hemorrhage, hydrocephalus, extra-axial collection or mass lesion/mass effect. Symmetric prominence of the ventricles, cisterns and sulci compatible with parenchymal volume loss. Patchy areas of white matter hypoattenuation are most compatible with chronic microvascular angiopathy. Vascular: Atherosclerotic calcification of the carotid siphons. No hyperdense vessel. Skull: No calvarial fracture or suspicious osseous lesion. No scalp swelling or hematoma. Sinuses/Orbits: Paranasal sinuses and mastoid air cells are predominantly clear. Included orbital structures are unremarkable. Other: None IMPRESSION: 1. No acute intracranial abnormality. 2. Mild parenchymal volume loss and chronic microvascular angiopathy. Electronically Signed   By: Lovena Le M.D.   On: 10/15/2019 00:34   US Abdomen Limited  Result Date: 10/15/2019 CLINICAL DATA:  Jaundice. EXAM: ULTRASOUND ABDOMEN LIMITED RIGHT UPPER QUADRANT COMPARISON:  CT dated Oct 15, 2019 FINDINGS: Gallbladder: The gallbladder wall is thickened  measuring approximately 6 mm. There are gallstones with gallbladder sludge. However, the sonographic Percell Miller sign is negative. Common bile duct: Diameter: 3 mm Liver: Diffuse increased echogenicity with slightly heterogeneous liver. Appearance typically secondary to fatty infiltration. Fibrosis secondary consideration. No secondary findings of cirrhosis noted. No focal hepatic lesion or intrahepatic biliary duct dilatation. The liver surface is nodular concerning for cirrhosis. Portal vein is patent on color Doppler imaging with normal direction of blood flow towards the liver. Other: There is a moderate to large volume of abdominal ascites. IMPRESSION: 1. Findings are equivocal for acute cholecystitis. While there is gallbladder wall thickening in the presence of cholelithiasis, the sonographic Percell Miller sign is negative. Correlation with laboratory studies is recommended. Gallbladder wall thickening is a nonspecific finding can be seen in the presence of ascites. 2. Large volume abdominal ascites. 3. Hepatic steatosis. There is a nodular contour of the liver suggestive of underlying cirrhosis. Electronically Signed   By: Constance Holster M.D.   On: 10/15/2019 02:02   US Paracentesis  Result Date: 10/15/2019 INDICATION: Patient with a history of alcoholism and decompensated liver cirrhosis. Patient presented to the ED with altered mental status and ascites. Interventional radiology asked to perform a diagnostic and therapeutic paracentesis. EXAM: ULTRASOUND GUIDED right PARACENTESIS MEDICATIONS: 1% lidocaine 10 mL COMPLICATIONS: None immediate PROCEDURE: Informed written consent was obtained from the patient after a discussion of the risks, benefits and alternatives to treatment. A timeout was performed prior to the initiation of the procedure. Initial ultrasound scanning demonstrates a large amount of ascites within the right lower abdominal quadrant. The right lower abdomen was prepped and draped in the usual  sterile fashion. 1% lidocaine was used for local anesthesia. Following this, a 19 gauge, 7-cm, Yueh catheter was introduced. An ultrasound image was saved for documentation purposes. The paracentesis was performed.  The catheter was removed and a dressing was applied. The patient tolerated the procedure well without immediate post procedural complication. Patient received post-procedure intravenous albumin; see nursing notes for details. FINDINGS: A total of approximately 3750 mL of clear yellow fluid was removed. Samples were sent to the laboratory as requested by the clinical team. IMPRESSION: Successful ultrasound-guided paracentesis yielding 3750 mL of peritoneal fluid. Read by: Alwyn Ren, NP Electronically Signed   By: Irish Lack M.D.   On: 10/15/2019 10:29   DG Chest Port 1 View  Result Date: 10/14/2019 CLINICAL DATA:  Hepatitis C, cirrhosis, altered level of consciousness EXAM: PORTABLE CHEST 1 VIEW COMPARISON:  08/17/2019 FINDINGS: The heart size and mediastinal contours are within normal limits. Both lungs are clear. The visualized skeletal structures are unremarkable. IMPRESSION: No active disease. Electronically Signed   By: Sharlet Salina M.D.   On: 10/14/2019 23:23   CT Renal Stone Study  Result Date: 10/15/2019 CLINICAL DATA:  Flank pain. Kidney stones suspected. EXAM: CT ABDOMEN AND PELVIS WITHOUT CONTRAST TECHNIQUE: Multidetector CT imaging of the abdomen and pelvis was performed following the standard protocol without IV contrast. COMPARISON:  None. FINDINGS: Lower chest: There are trace bilateral pleural effusions.The heart size is normal. The intracardiac blood pool is hypodense relative to the adjacent myocardium consistent with anemia. Hepatobiliary: There is severe diffuse hepatic steatosis. There is cholelithiasis. There may be some gallbladder wall thickening.There is no biliary ductal dilation. Pancreas: Normal contours without ductal dilatation. No peripancreatic fluid  collection. Spleen: Unremarkable. Adrenals/Urinary Tract: --Adrenal glands: Unremarkable. --Right kidney/ureter: No hydronephrosis or radiopaque kidney stones. --Left kidney/ureter: No hydronephrosis or radiopaque kidney stones. --Urinary bladder: Unremarkable. Stomach/Bowel: --Stomach/Duodenum: There is a moderate-sized hiatal hernia. --Small bowel: Unremarkable. --Colon: Unremarkable. --Appendix: Normal. Vascular/Lymphatic: Atherosclerotic calcification is present within the non-aneurysmal abdominal aorta, without hemodynamically significant stenosis. --No retroperitoneal lymphadenopathy. --No mesenteric lymphadenopathy. --No pelvic or inguinal lymphadenopathy. Reproductive: Unremarkable Other: There is a moderate to large volume of ascites. The abdominal wall is normal. Musculoskeletal. No acute displaced fractures. IMPRESSION: 1. No radiopaque kidney stones. 2. Severe diffuse hepatic steatosis. 3. Cholelithiasis with possible gallbladder wall thickening. If there is concern for acute cholecystitis, recommend further evaluation with right upper quadrant ultrasound. 4. Moderate to large volume of ascites. 5. Trace bilateral pleural effusions. Aortic Atherosclerosis (ICD10-I70.0). Electronically Signed   By: Katherine Mantle M.D.   On: 10/15/2019 00:35    Microbiology: Recent Results (from the past 240 hour(s))  SARS Coronavirus 2 by RT PCR (hospital order, performed in [facility] hospital lab) Nasopharyngeal Nasopharyngeal Swab     Status: None   Collection Time: 10/15/19 12:21 AM   Specimen: Nasopharyngeal Swab  Result Value Ref Range Status   SARS Coronavirus 2 NEGATIVE NEGATIVE Final    Comment: (NOTE) SARS-CoV-2 target nucleic acids are NOT DETECTED. The SARS-CoV-2 RNA is generally detectable in upper and lower respiratory specimens during the acute phase of infection. The lowest concentration of SARS-CoV-2 viral copies this assay can detect is 250 copies / mL. A negative result does not  preclude SARS-CoV-2 infection and should not be used as the sole basis for treatment or other patient management decisions.  A negative result may occur with improper specimen collection / handling, submission of specimen other than nasopharyngeal swab, presence of viral mutation(s) within the areas targeted by this assay, and inadequate number of viral copies (<250 copies / mL). A negative result must be combined with clinical observations, patient history, and epidemiological information. Fact Sheet for Patients:  BoilerBrush.com.cy Fact Sheet for Healthcare Providers: https://pope.com/ This test is not yet approved or cleared  by the [place] FDA and has been authorized for detection and/or diagnosis of SARS-CoV-2 by FDA under an Emergency Use Authorization (EUA).  This EUA will remain in effect (meaning this test can be used) for the duration of the COVID-19 declaration under Section 564(b)(1) of the Act, 21 U.S.C. section 360bbb-3(b)(1), unless the authorization is terminated or revoked sooner. Performed at [facility], [address], [place], [place] [address]   Gram stain     Status: None   Collection Time: 10/15/19 10:32 AM   Specimen: Peritoneal Washings  Result Value Ref Range Status   Specimen Description PERITONEAL FLUID  Final   Special Requests NONE  Final   Gram Stain   Final    WBC PRESENT, PREDOMINANTLY MONONUCLEAR NO ORGANISMS SEEN CYTOSPIN SMEAR Performed at [facility], [address], [place], [place] [address]    Report Status 10/15/2019 FINAL  Final  Culture, body fluid-bottle     Status: None (Preliminary result)   Collection Time: 10/15/19 10:32 AM   Specimen: Peritoneal Washings  Result Value Ref Range Status   Specimen Description PERITONEAL FLUID  Final   Special Requests NONE  Final   Culture   Final    NO GROWTH 4 DAYS Performed at [facility], [address], [place], [place] [address]    Report Status PENDING  Incomplete  MRSA PCR Screening     Status: None   Collection Time: 10/15/19  3:22 PM   Specimen: Nasopharyngeal  Result Value Ref Range Status   MRSA by PCR NEGATIVE NEGATIVE Final    Comment:        The GeneXpert MRSA Assay (FDA approved for NASAL specimens only), is one component of a comprehensive MRSA colonization surveillance program. It is not intended to diagnose MRSA infection nor to guide or monitor treatment for MRSA infections. Performed at [facility], [address], [place], [place] [address]      Labs: Basic Metabolic Panel: Recent Labs  Lab 10/15/19 [phone] 10/15/19 0629 10/15/19 1911 10/15/19 1911 10/16/19 0626 10/16/19 0626 10/16/19 2033 10/17/19 0228 10/18/19 0243 10/18/19 1313 10/19/19 0306  NA 130*  --   --   --  131*  --   --  130* 133*  --  130*  K 2.3*   < > 2.4*   < > 2.8*   < > 4.6 3.3* 2.8* 3.0* 4.0  CL 75*  --   --   --  81*  --   --  85* 86*  --  88*  CO2 36*  --   --   --  36*  --   --  32 30  --  32  GLUCOSE 78  --   --   --  76  --   --  113* 99  --  113*  BUN 7  --   --   --  7  --   --  8 9  --  9  CREATININE 1.41*  --   --   --  1.23  --   --  0.96 1.05  --  0.81  CALCIUM 7.2*  --   --   --  7.9*  --   --  7.6* 7.9*  --  8.3*  MG 1.6*  --  2.8*  --  2.9*  --   --  2.7* 2.7*  --   --   PHOS 3.0  --   --   --   --   --   --   --   --   --   --    < > = values in this interval not displayed.   Liver Function Tests: Recent Labs  Lab 10/15/19 0629 10/16/19 0626 10/17/19 0228 10/18/19 0243 10/19/19 0306  AST 270* 198* 150* 132* 125*  ALT 65* 56* 51* 50* 51*  ALKPHOS 167* 134* 114 122 128*  BILITOT 19.4* 22.3* 24.5* 25.3* 27.7*  PROT 4.9* 5.7* 5.1* 5.2* 5.3*  ALBUMIN 1.5* 2.9* 2.7* 2.5* 2.4*   Recent Labs  Lab 10/14/19 2155  LIPASE 66*   Recent Labs  Lab 10/14/19 2330 10/16/19 0905 10/16/19 1715 10/17/19 0228  AMMONIA 81* 59* 48* 59*    CBC: Recent Labs  Lab 10/14/19 2155 10/14/19 2155 10/15/19 0629 10/16/19 0626 10/16/19 1712 10/17/19 0228 10/18/19 0243  WBC 11.6*  --  9.1 8.1  --  10.6* 13.3*  NEUTROABS  --   --  7.0 6.0  --  9.3* 10.0*  HGB 8.9*   < > 8.4* 7.2* 9.4* 8.6* 8.6*  HCT 25.3*   < > 23.6* 20.8* 27.8* 25.3* 25.8*  MCV 104.1*  --  104.9* 107.2*  --  103.3* 103.6*  PLT 158  --  125* 96*  --  77* 101*   < > = values in this interval not displayed.    Principal Problem:   Acute hepatic encephalopathy Active Problems:   Decompensated hepatic cirrhosis (HCC)   Hypothyroidism   Hypokalemia   Hypomagnesemia   AKI (acute kidney injury) (HCC)   Hyponatremia   Macrocytic anemia   Coagulopathy (HCC)   Pressure injury of skin   Thrombocytopenia (HCC)   Severe protein-calorie malnutrition (HCC)   Palliative care by specialist   Goals of care, counseling/discussion   General weakness   Time coordinating discharge: 35 minutes  Signed:  Brendia Sacksaniel Isley Weisheit, MD  [facility]  10/19/2019, 12:47 PM

## 2019-10-19 NOTE — Plan of Care (Signed)
Discussed with family in front of the patient plan of care for the evening, pain management and nutritional intact with some teach back displayed.  Discussed the purpose of the NG tube and answered all siblings questions at this time; given direct number for contact til morning.

## 2019-10-19 NOTE — TOC Progression Note (Signed)
Transition of Care [facility]) - Progression Note    Patient Details  Name: Corey Fernandez MRN: [id number] Date of Birth: Oct 04, 1965  [facility] CM/SW Contact  Elliot Gault, LCSW Phone Number: 10/19/2019, 10:42 AM  Clinical Narrative:     Received consult from MD stating family requesting residential hospice placement. Per MD, family prefers a facility in [place] or [place]. Spoke with pt's brother, Ines Bloomer, by phone to discuss. Per Ines Bloomer, they are open to a referral to any facility in the area that has a bed available.   Spoke with liaison from [facility] and they do not have bed availability today. Spoke with liaison from [facility] and they will review to determine if they can accept pt at either their [place] or their [facility] facility today.   Updated MD. Will follow up once more information is available.  Expected Discharge Plan: Skilled Nursing Facility Barriers to Discharge: Continued Medical Work up  Expected Discharge Plan and Services Expected Discharge Plan: Skilled Nursing Facility   Discharge Planning Services: CM Consult Post Acute Care Choice: Skilled Nursing Facility Living arrangements for the past 2 months: Single Family Home                                       Social Determinants of Health (SDOH) Interventions    Readmission Risk Interventions No flowsheet data found.

## 2019-10-19 NOTE — Progress Notes (Signed)
Report called  Given to Verne Spurr, RN at [facility] [place] in [employer] [place]. No questions at this time

## 2019-10-19 NOTE — Progress Notes (Signed)
PROGRESS NOTE  Corey Fernandez KXF:[id number] DOB: 12/29/1965 DOA: 10/14/2019 PCP: Lucia Gaskins, MD  Brief History   54 year old man PMH alcohol abuse, hepatitis C admitted for acute hepatic encephalopathy, decompensated liver cirrhosis with ascites, alcoholic hepatitis  A & P  Acute hepatic encephalopathy, decompensated liver cirrhosis with ascites, hyponatremia, thrombocytopenia, macrocytic anemia, alcoholic hepatitis. MELD 31 on admission.  Status post paracentesis 5/24. --Remains encephalopathic, total bilirubin worse, no significant improvement noted. --Discussed with sister at bedside, all family members including mother and two brothers request full comfort care and transition to residential hospice.  Alcohol abuse --No evidence of withdrawal  Hypokalemia --Resolved  Hypothyroidism on levothyroxine as an outpatient --TSH was low, Synthroid decreased.  No clinical features of hyperthyroidism. --No further evaluation suggested  Severe protein calorie malnutrition  Acute kidney injury, resolved with treatment  Disposition Plan:  From: Home Anticipated disposition: Residential hospice Discussion: End-stage liver disease, failure to improve despite aggressive treatment, family unified in request for comfort care and transition to residential hospice.  Full comfort measures, transfer to medical bed unless hospice.  Immediately available.  Status is: Inpatient  Remains inpatient appropriate because:Altered mental status   Dispo: The patient is from: Home              Anticipated d/c is to: Residential hospice              Anticipated d/c date is: When bed available         DVT prophylaxis: SCDs Code Status: DNR Family Communication: sister at bedside 5/28    Murray Hodgkins, MD  [facility] Direct contact: see [email or web address] (further directions at bottom of note if needed) 7PM-7AM contact night coverage as at bottom of note 10/19/2019, 10:21 AM  LOS: 4 days    Significant Hospital Events   . 5/23 admitted for decompensated cirrhosis   Consults:  . Gastroenterology   Procedures:  . 5/24 paracentesis with removal of 3.5 L . 5/25 transfusion 1 unit PRBCs  Significant Diagnostic Tests:  Marland Kitchen    Micro Data:   Peritoneal fluid gram stain negative, cultures no growth to date   Antimicrobials:  .   Interval History/Subjective  Seems to feel okay.  Sister at bedside.  Objective   Vitals:  Vitals:   10/19/19 0532 10/19/19 0800  BP:    Pulse: 96   Resp: 19   Temp:  (!) 97.5 F (36.4 C)  SpO2: 99%     Exam:  Constitutional.  Appears to be resting comfortably.  Briefly arouses to voice. Respiratory.  Clear to auscultation bilaterally.  No wheezes, rales or rhonchi.  Normal respiratory effort. Cardiovascular.  Regular rate and rhythm.  No murmur, rub or gallop. Abdomen.  Soft.  Distended. GU.  Foley catheter in place. Psychiatric.  Only briefly arouses to voice and goes back to sleep.  I have personally reviewed the following:   Today's Data  . Sodium 130, AST and ALT without significant change, total bilirubin up to 27.7.  Scheduled Meds: . Chlorhexidine Gluconate Cloth  6 each Topical Daily  . LORazepam  0-4 mg Intravenous Q8H  . rifaximin  550 mg Per Tube BID  . sodium chloride flush  3 mL Intravenous Q12H  . spironolactone  25 mg Per Tube Daily   Continuous Infusions:   Principal Problem:   Acute hepatic encephalopathy Active Problems:   Decompensated hepatic cirrhosis (HCC)   Hypothyroidism   Hypokalemia   Hypomagnesemia   AKI (acute kidney injury) ([place])   Hyponatremia  Macrocytic anemia   Coagulopathy (HCC)   Pressure injury of skin   Thrombocytopenia (HCC)   Severe protein-calorie malnutrition (HCC)   LOS: 4 days   How to contact the [facility] Attending or Consulting provider 7A - 7P or covering provider during after hours 7P -7A, for this patient?  1. Check the care team in [facility] and look for a)  attending/consulting TR[facility] provider listed and b) the [facility] team listed 2. Log into [email or web address] and use [place]'s universal password to access. If you do not have the password, please contact the hospital operator. 3. Locate the [facility] provider you are looking for under [facility] and page to a number that you can be directly reached. 4. If you still have difficulty reaching the provider, please page the [facility] (Director on Call) for the Hospitalists listed on amion for assistance.

## 2019-10-19 NOTE — Progress Notes (Signed)
[facility]: [employer] tot he pt's brother Ines Bloomer and confirmed interest in comfort care and transition to the [facility].   The family is in agreement. The pt was reviewed with our MD and approved for services.   The brother Ines Bloomer will complete paperwork by email. Once this is received back he will be ready for transport if MD at [facility] feels he is appropriate.   The phone number for nurse to call report is [phone]  Thank you Norm Parcel RN [phone]

## 2019-10-19 NOTE — Progress Notes (Signed)
[facility] Progress Note  Corey Fernandez 54 y.o. 11/11/1965  CC: Cirrhosis, encephalopathy, abnormal LFTs   Subjective: Patient seen and examined at bedside.  He is resting comfortably.  Moves his head with verbal command.  Not able to obtain any history from patient.  Discussed with RN at bedside.  No acute changes since yesterday.  Having loose stools with lactulose but denies seeing any blood in the stool.   ROS : Not able to obtain   Objective: Vital signs in last 24 hours: Vitals:   10/19/19 0900 10/19/19 1000  BP: 109/65 122/79  Pulse: 97 97  Resp: 14 (!) 30  Temp:    SpO2: 100% 100%    Physical Exam:  General.  Patient lethargic and  sleepy.  Not able to give any history.   Appears deeply jaundiced. Abdomen mildly distended.  Soft, bowel sounds present.  No peritoneal signs No lower extremity edema  Lab Results: Recent Labs    10/17/19 0228 10/17/19 0228 10/18/19 0243 10/18/19 0243 10/18/19 1313 10/19/19 0306  NA 130*   < > 133*  --   --  130*  K 3.3*   < > 2.8*   < > 3.0* 4.0  CL 85*   < > 86*  --   --  88*  CO2 32   < > 30  --   --  32  GLUCOSE 113*   < > 99  --   --  113*  BUN 8   < > 9  --   --  9  CREATININE 0.96   < > 1.05  --   --  0.81  CALCIUM 7.6*   < > 7.9*  --   --  8.3*  MG 2.7*  --  2.7*  --   --   --    < > = values in this interval not displayed.   Recent Labs    10/18/19 0243 10/19/19 0306  AST 132* 125*  ALT 50* 51*  ALKPHOS 122 128*  BILITOT 25.3* 27.7*  PROT 5.2* 5.3*  ALBUMIN 2.5* 2.4*   Recent Labs    10/17/19 0228 10/18/19 0243  WBC 10.6* 13.3*  NEUTROABS 9.3* 10.0*  HGB 8.6* 8.6*  HCT 25.3* 25.8*  MCV 103.3* 103.6*  PLT 77* 101*   Recent Labs    10/18/19 0243 10/19/19 0306  LABPROT 19.1* 18.9*  INR 1.7* 1.7*      Assessment/Plan: -Jaundice with total bilirubin of 22.3 on admission.  AST 102, ALT 76 and alkaline phosphatase 201.  Most likely decompensated cirrhosis versus alcoholic hepatitis.   Discriminant function score of 40.2. MELD 32 as of 05/23, discriminant function score of 45 05/25  -Hepatic encephalopathy.  -History of hepatitis C. hepatitis panel and HCV PCR negative. -Ongoing alcohol use -Ascites -Hepatic encephalopathy.  He has been on lactulose since 2012 according to brother. -Electrolyte imbalance -management per primary team  Recommendations ------------------------- -Discussed with patient's brother over the phone today. -Kidney functions improved but LFTs trending up. -Family is considering comfort care/hospice care -Continue prednisolone for now. -GI will follow periodically if the patient remains in the hospital   Kathi Der MD, FACP 10/19/2019, 11:13 AM  Contact #  [phone]

## 2019-10-19 NOTE — Progress Notes (Signed)
Daily Progress Note   Patient Name: Corey Fernandez       Date: 10/19/2019 DOB: [birth year]  Age: 54 y.o. MRN#: [id number] Attending Physician: Samuella Cota, MD Primary Care Physician: Lucia Gaskins, MD Admit Date: 10/14/2019  Reason for Consultation/Follow-up: Establishing goals of care  Subjective: Patient appears with generalized weakness, is resting in bed.  Does not arouse to gentle stimulation.  Sister is present at the bedside.  Length of Stay: 4  Current Medications: Scheduled Meds:  . Chlorhexidine Gluconate Cloth  6 each Topical Daily  . LORazepam  0-4 mg Intravenous Q8H  . rifaximin  550 mg Per Tube BID  . sodium chloride flush  3 mL Intravenous Q12H  . spironolactone  25 mg Per Tube Daily    Continuous Infusions:   PRN Meds: LORazepam **OR** LORazepam, ondansetron **OR** ondansetron (ZOFRAN) IV  Physical Exam         Appears lethargic and anicteric does not respond or interact Abdomen mildly distended No edema Appears to have shallow regular work of breathing Does not have nonverbal gestures of distress or discomfort Appears chronically ill  Vital Signs: BP 122/79 (BP Location: Left Arm)   Pulse 97   Temp (!) 97.5 F (36.4 C) (Axillary)   Resp (!) 30   Ht 5\' 9"  (1.753 m)   Wt 63.6 kg   SpO2 100%   BMI 20.71 kg/m  SpO2: SpO2: 100 % O2 Device: O2 Device: Room Air O2 Flow Rate: O2 Flow Rate (L/min): 1 L/min  Intake/output summary:   Intake/Output Summary (Last 24 hours) at 10/19/2019 1203 Last data filed at 10/19/2019 0500 Gross per 24 hour  Intake 1181.16 ml  Output 1075 ml  Net 106.16 ml   LBM: Last BM Date: 10/18/19 Baseline Weight: Weight: 58 kg Most recent weight: Weight: 63.6 kg       Palliative Assessment/Data:      Patient  Active Problem List   Diagnosis Date Noted  . Thrombocytopenia ([place]) 10/18/2019  . Severe protein-calorie malnutrition ([place]) 10/18/2019  . Pressure injury of skin 10/17/2019  . Acute hepatic encephalopathy 10/15/2019  . Decompensated hepatic cirrhosis ([place]) 10/15/2019  . Coagulopathy ([place]) 10/15/2019  . Hypothyroidism   . Hypokalemia   . Hypomagnesemia   . AKI (acute kidney injury) ([place])   . Hyponatremia   . Macrocytic  anemia     Palliative Care Assessment & Plan   Patient Profile:    Assessment: Hepatic encephalopathy, hepatitis C, EtOH use, hepatic encephalopathy, ascites, high meld score, decompensated cirrhosis, functional decline  Recommendations/Plan:  I checked in and offered supportive care and compassionate presence to patient's sister who was present at the bedside.  Family has elected to proceed with residential hospice with exclusive focus on aggressive symptom management comfort and dignity at end-of-life.  Agree with this plan.  Briefly discussed end-of-life signs and symptoms and how residential hospice facility staff can help at this stage.  All of her questions addressed to the best of my ability.  Goals of Care and Additional Recommendations:  Limitations on Scope of Treatment: Full Comfort Care  Code Status:    Code Status Orders  (From admission, onward)         Start     Ordered   10/15/19 1127  Do not attempt resuscitation (DNR)  Continuous    Question Answer Comment  In the event of cardiac or respiratory ARREST Do not call a "code blue"   In the event of cardiac or respiratory ARREST Do not perform Intubation, CPR, defibrillation or ACLS   In the event of cardiac or respiratory ARREST Use medication by any route, position, wound care, and other measures to relive pain and suffering. May use oxygen, suction and manual treatment of airway obstruction as needed for comfort.      10/15/19 1126        Code Status History    Date Active Date  Inactive Code Status Order ID Comments User Context   10/15/2019 0302 10/15/2019 1126 Full Code [id number]  Briscoe Deutscher, MD ED   Advance Care Planning Activity       Prognosis:   < 2 weeks  Discharge Planning:  Hospice facility  Care plan was discussed with  Sister at bedside and also with IDT  Thank you for allowing the Palliative Medicine Team to assist in the care of this patient.   Time In: 11 Time Out: 11.25 Total Time 25 Prolonged Time Billed  no       Greater than 50%  of this time was spent counseling and coordinating care related to the above assessment and plan.  Rosalin Hawking, MD  Please contact Palliative Medicine Team phone at [phone] for questions and concerns.

## 2019-10-20 LAB — CULTURE, BODY FLUID W GRAM STAIN -BOTTLE: Culture: NO GROWTH

## 2019-11-22 DEATH — deceased

## 2022-02-12 IMAGING — CT CT HEAD W/O CM
3 series · 16 of 47 positions shown, 19 images · non-contrast
Comparison: None.

CLINICAL DATA: Recurrent falls, liver failure, cirrhosis and
hepatitis C

EXAM:
CT HEAD WITHOUT CONTRAST
TECHNIQUE: Contiguous axial images were obtained from the base of the skull
through the vertex without intravenous contrast.

[Series 2: head wo · axial · 0.42mm/px · z∈[-145,-10]mm · 10 of 33 slices shown, 13 images]
[im 3/33  brain]
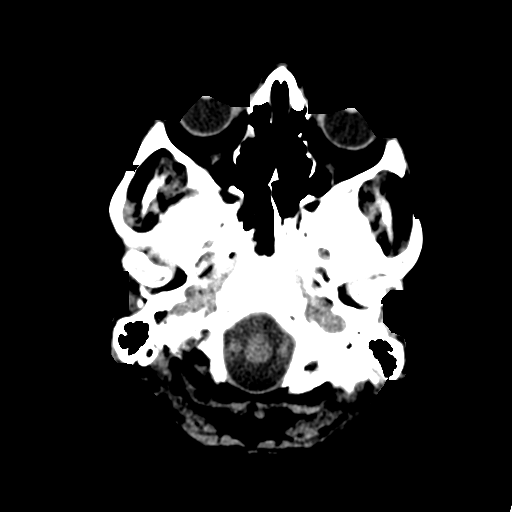
[im 3/33  bone]
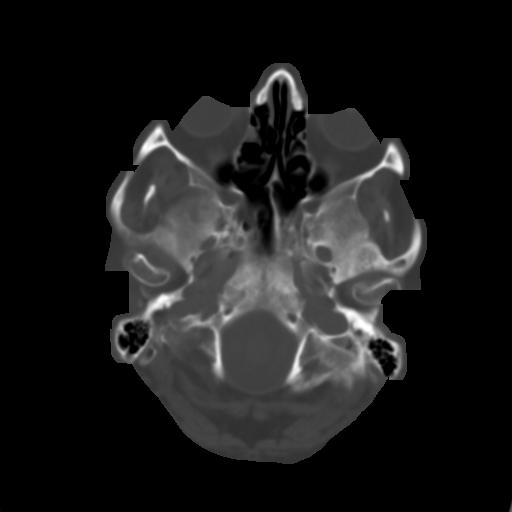
[im 6/33  brain]
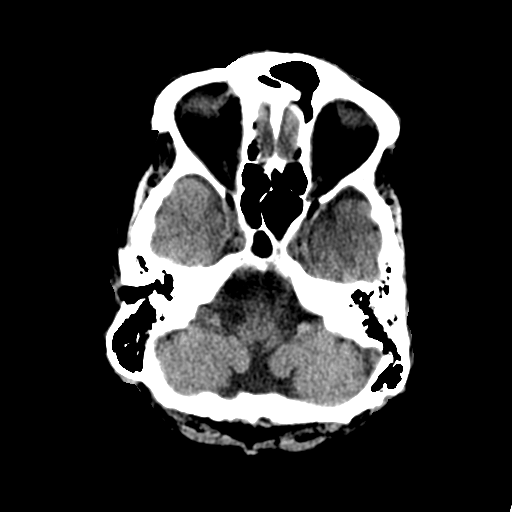
[im 9/33  brain]
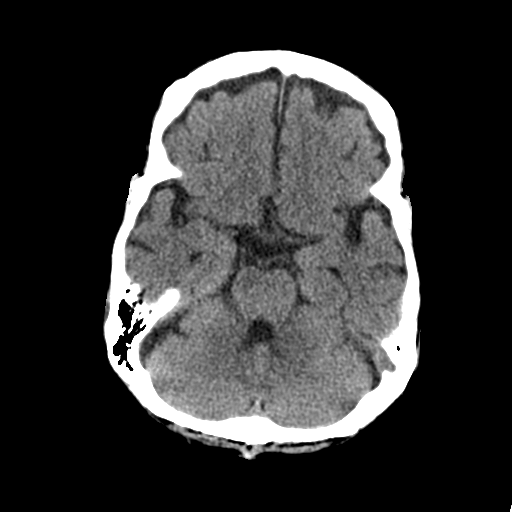
[im 12/33  brain]
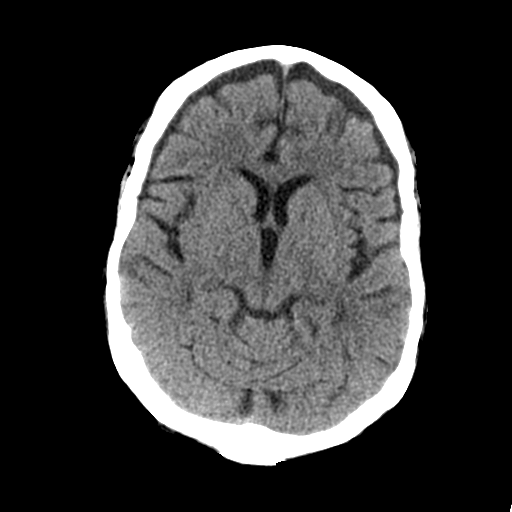
[im 15/33  brain]
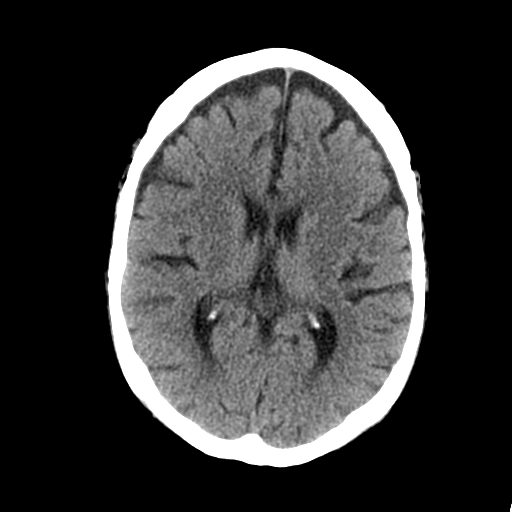
[im 15/33  bone]
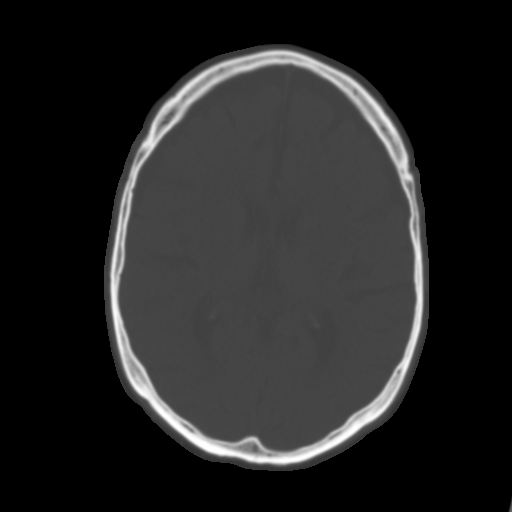
[im 18/33  brain]
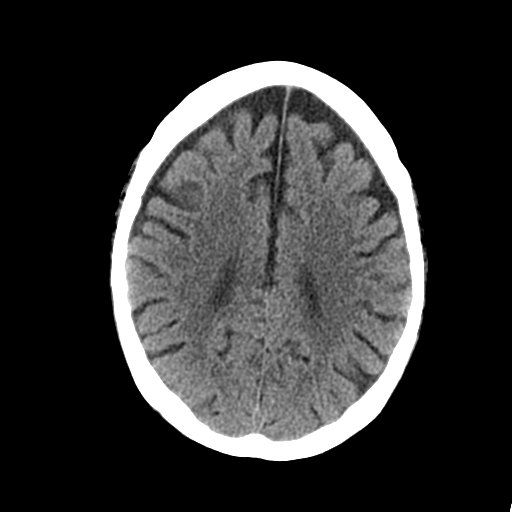
[im 21/33  brain]
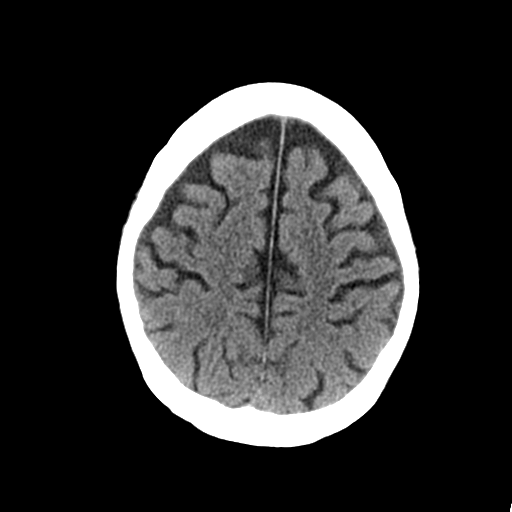
[im 25/33  brain]
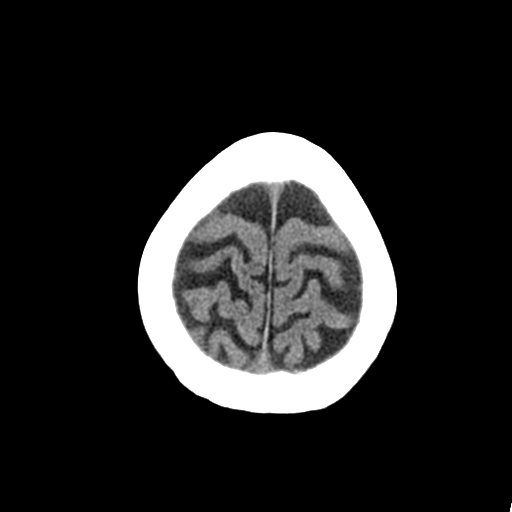
[im 27/33  brain]
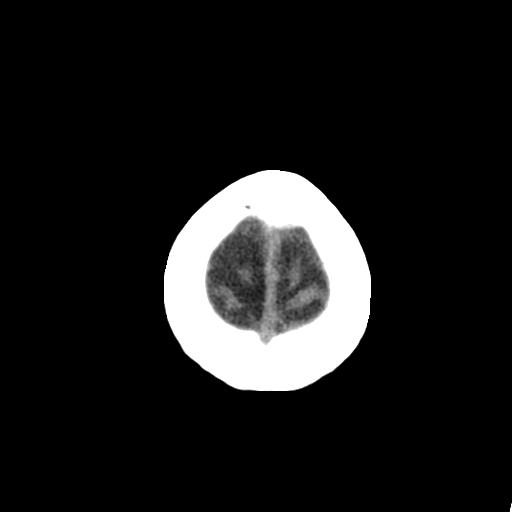
[im 27/33  bone]
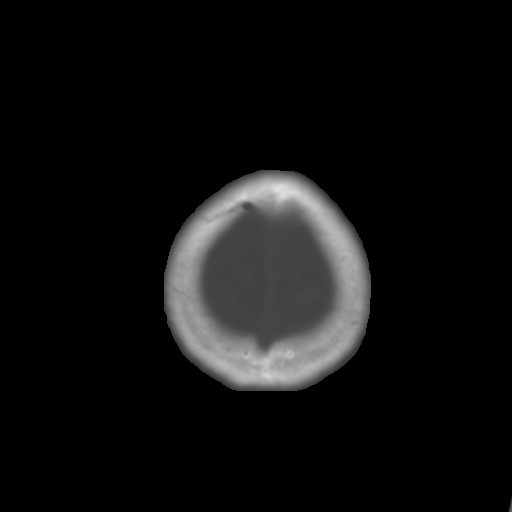
[im 30/33  brain]
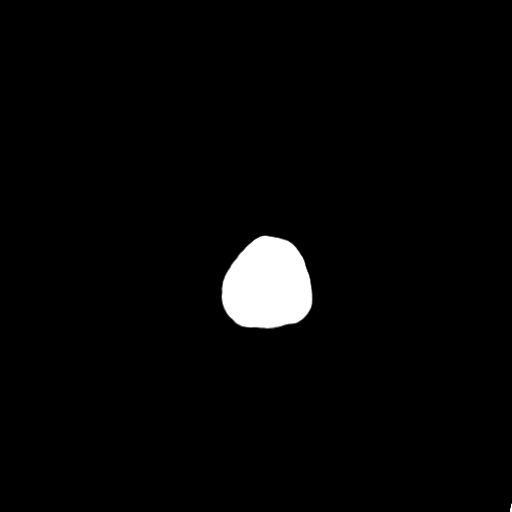

[Series 5: coronal soft tissue · coronal · 0.31mm/px · 3 of 68 slices shown]
[im 23/68  brain]
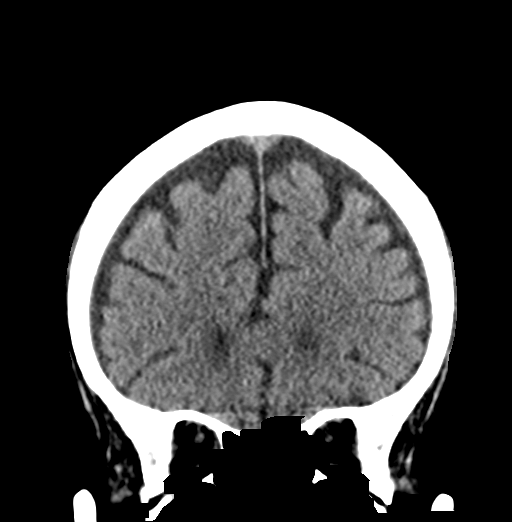
[im 30/68  brain]
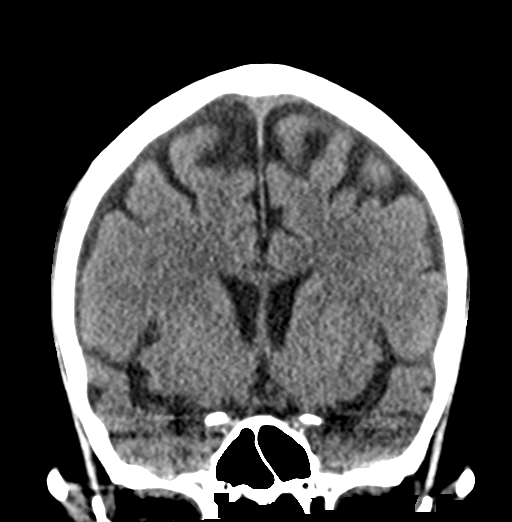
[im 38/68  brain]
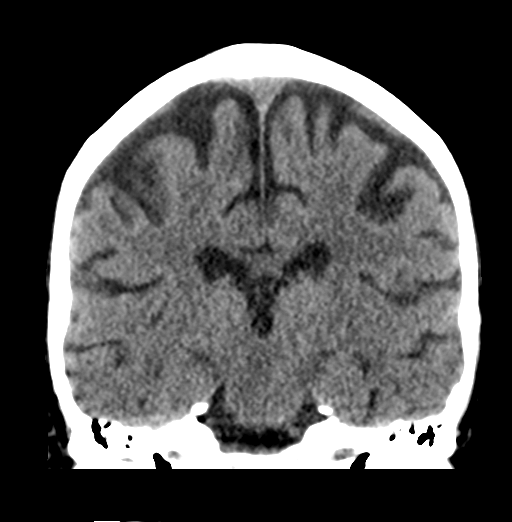

[Series 6: sagittal soft tissue · sagittal · 0.32mm/px · 3 of 53 slices shown]
[im 18/53  brain]
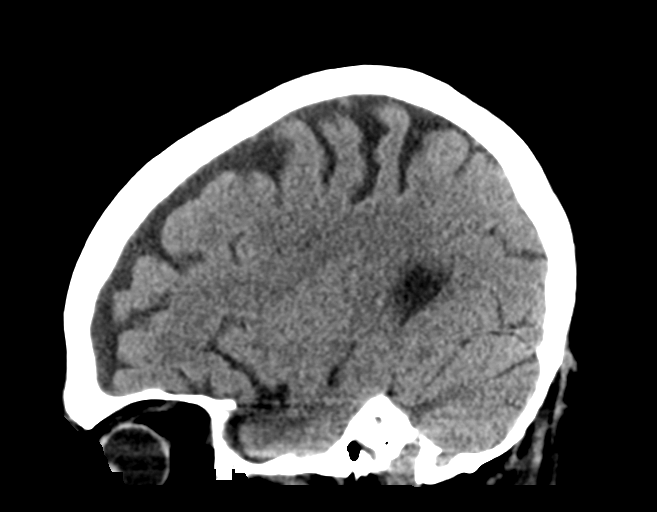
[im 27/53  brain]
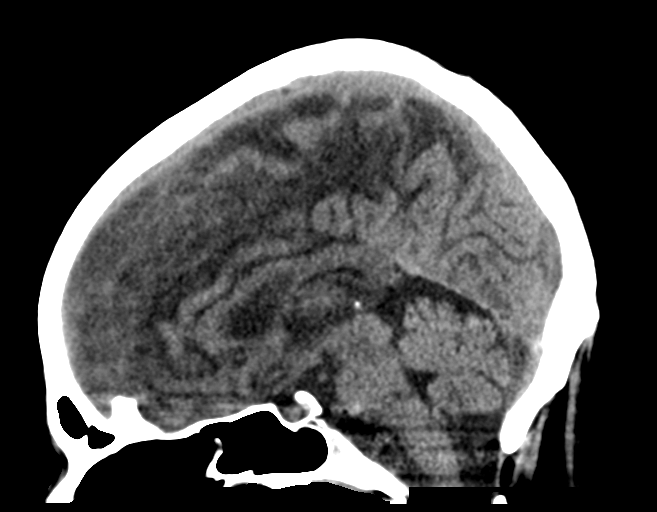
[im 35/53  brain]
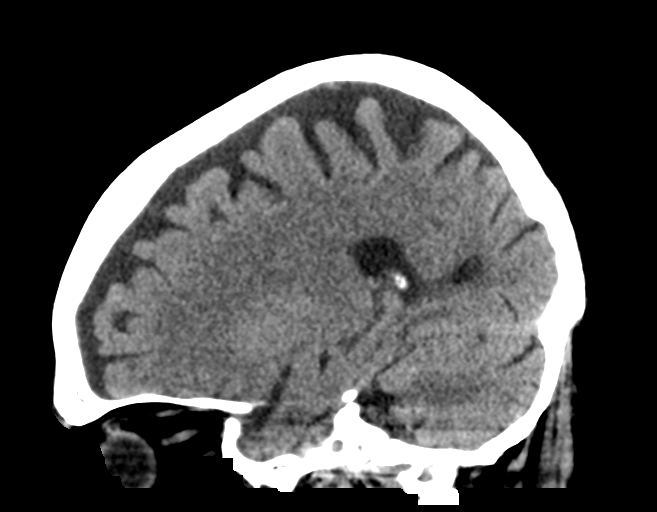

[16 of 47 positions shown; findings below may reference images not displayed]

FINDINGS: Brain: No evidence of acute infarction, hemorrhage, hydrocephalus,
extra-axial collection or mass lesion/mass effect. Symmetric
prominence of the ventricles, cisterns and sulci compatible with
parenchymal volume loss. Patchy areas of white matter
hypoattenuation are most compatible with chronic microvascular
angiopathy.

Vascular: Atherosclerotic calcification of the carotid siphons. No
hyperdense vessel.

Skull: No calvarial fracture or suspicious osseous lesion. No scalp
swelling or hematoma.

Sinuses/Orbits: Paranasal sinuses and mastoid air cells are
predominantly clear. Included orbital structures are unremarkable.

Other: None
IMPRESSION: 1. No acute intracranial abnormality.
2. Mild parenchymal volume loss and chronic microvascular
angiopathy.

## 2022-02-12 IMAGING — CT CT RENAL STONE PROTOCOL
2 of 4 series · 15 of 46 positions shown, 17 images · non-contrast
Comparison: None.

CLINICAL DATA: Flank pain. Kidney stones suspected.

EXAM:
CT ABDOMEN AND PELVIS WITHOUT CONTRAST
TECHNIQUE: Multidetector CT imaging of the abdomen and pelvis was performed
following the standard protocol without IV contrast.

[Series 2: axial st · axial · 0.71mm/px · z∈[-472,-12]mm · 12 of 104 slices shown, 14 images]
[im 6/104  soft-tissue]
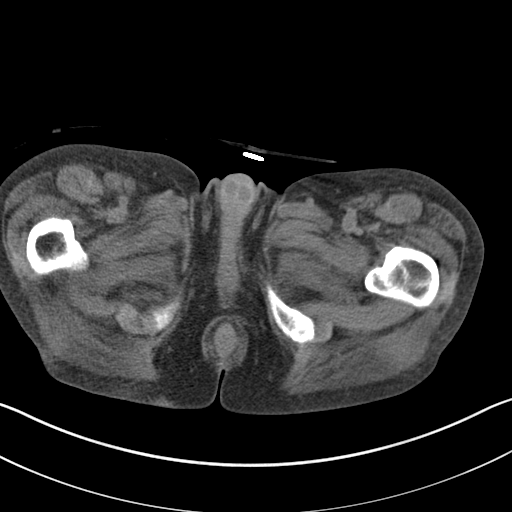
[im 6/104  bone]
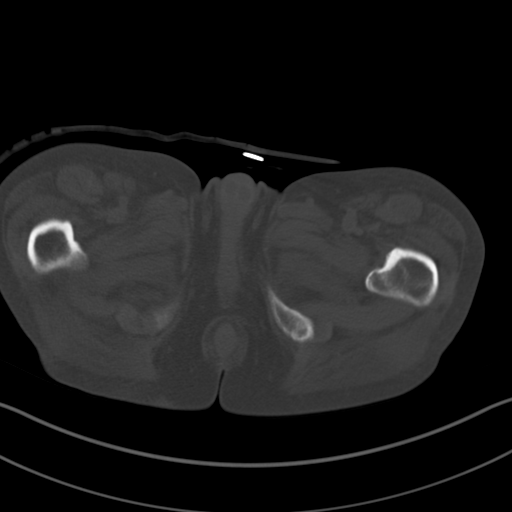
[im 17/104  soft-tissue]
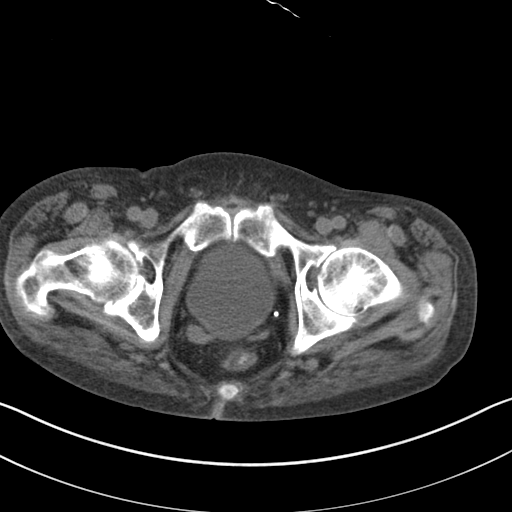
[im 22/104  soft-tissue]
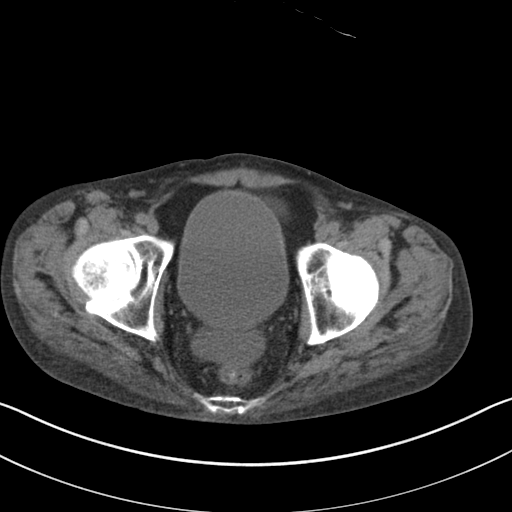
[im 33/104  soft-tissue]
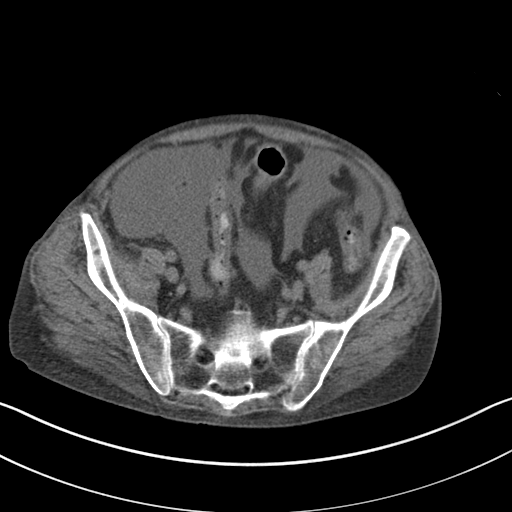
[im 38/104  soft-tissue]
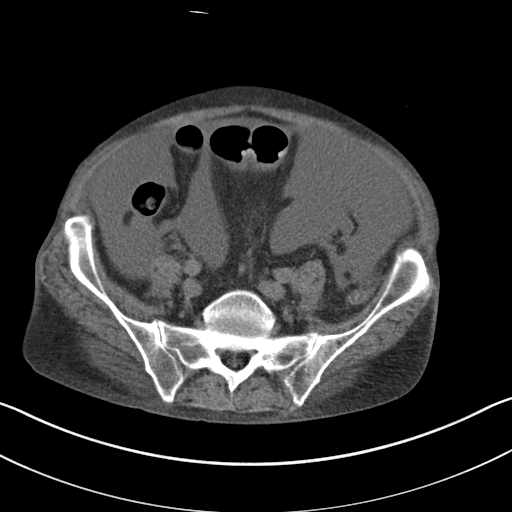
[im 49/104  soft-tissue]
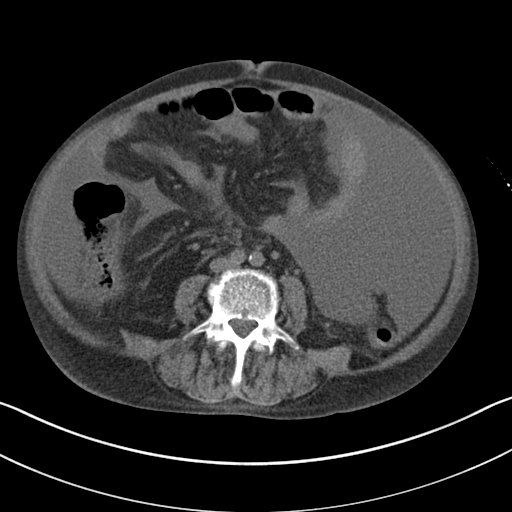
[im 55/104  soft-tissue]
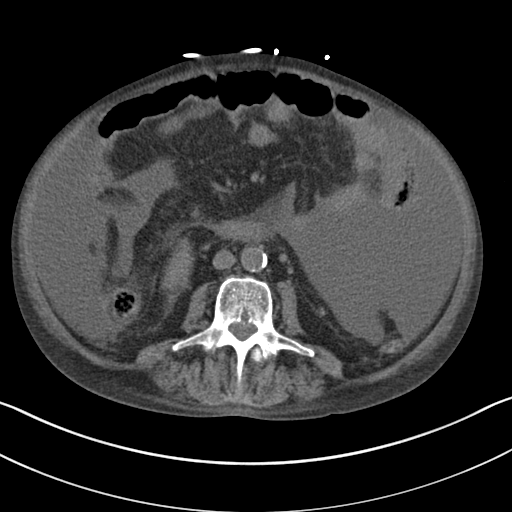
[im 66/104  soft-tissue]
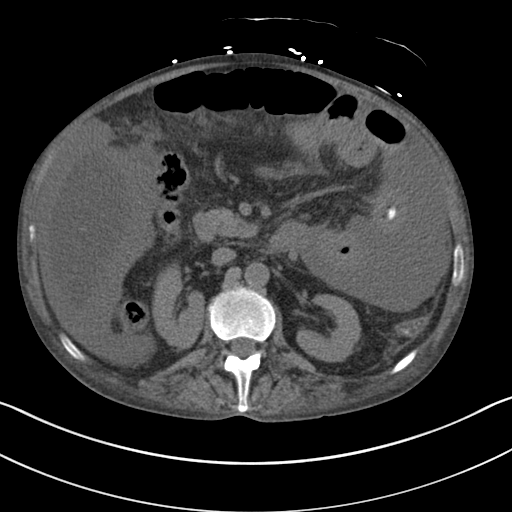
[im 71/104  soft-tissue]
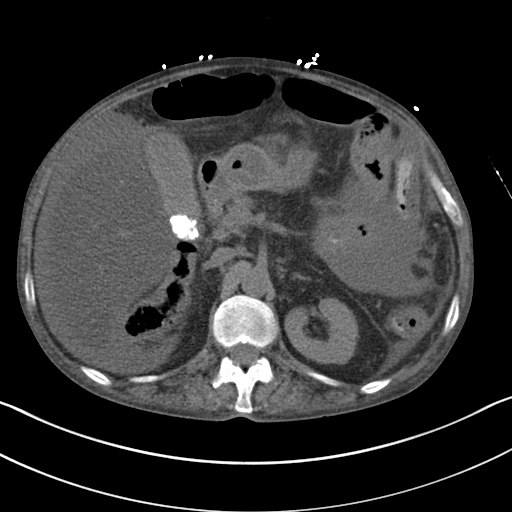
[im 71/104  bone]
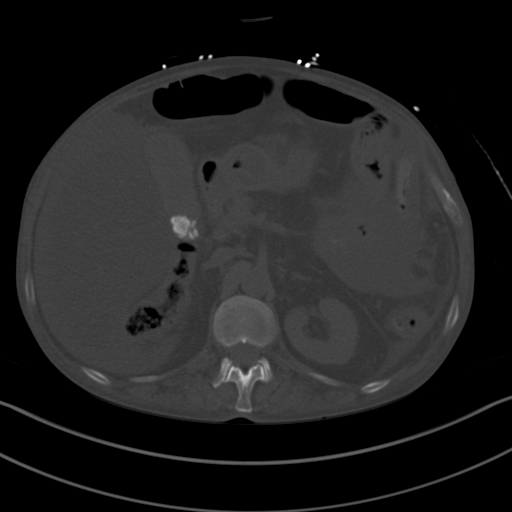
[im 82/104  soft-tissue]
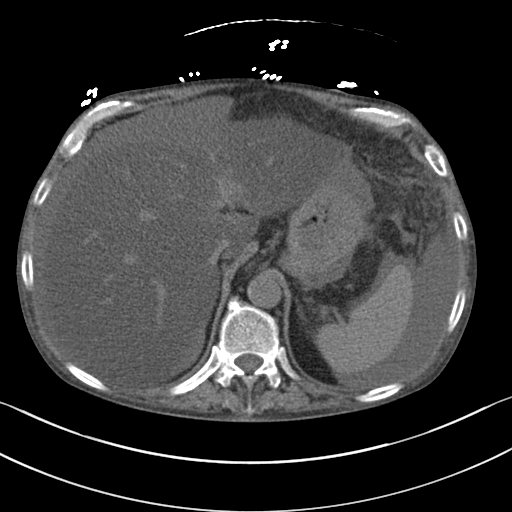
[im 87/104  soft-tissue]
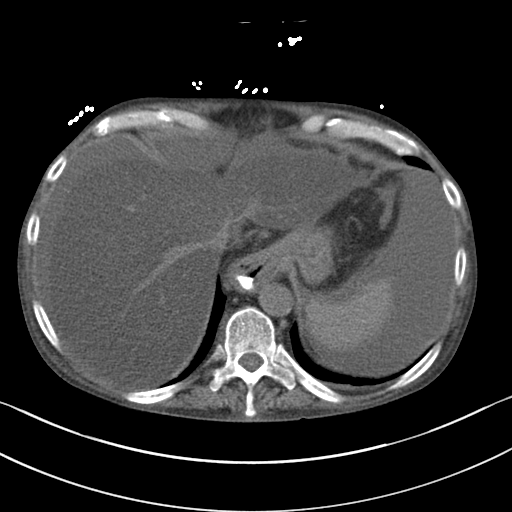
[im 98/104  soft-tissue]
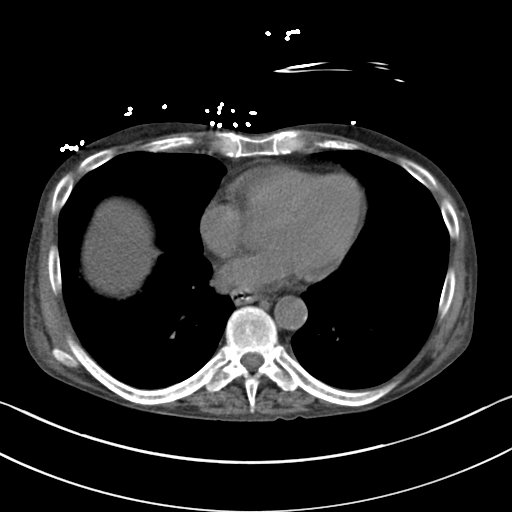

[Series 5: coronal · coronal · 0.70mm/px · 3 of 140 slices shown]
[im 47/140  soft-tissue]
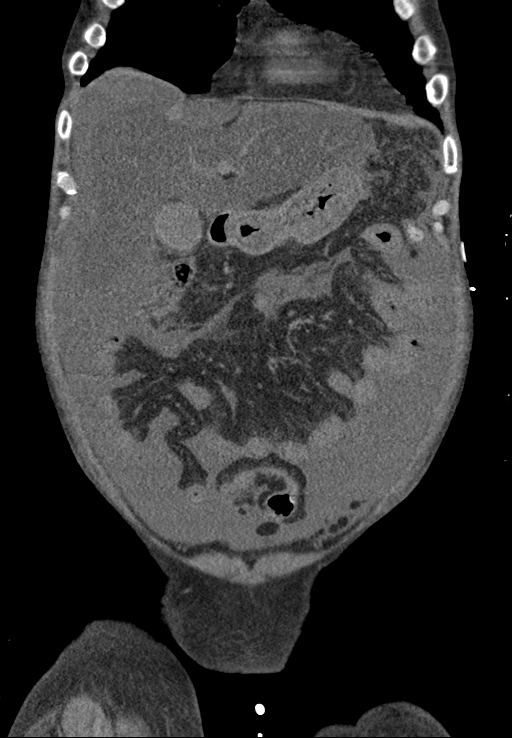
[im 62/140  soft-tissue]
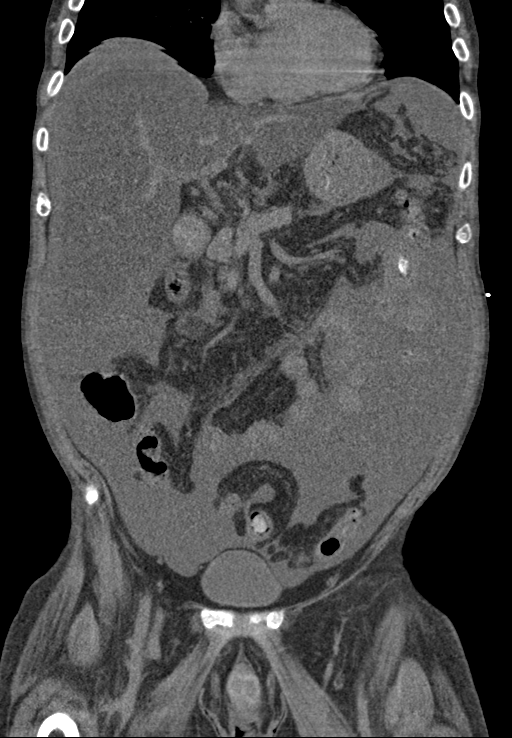
[im 78/140  soft-tissue]
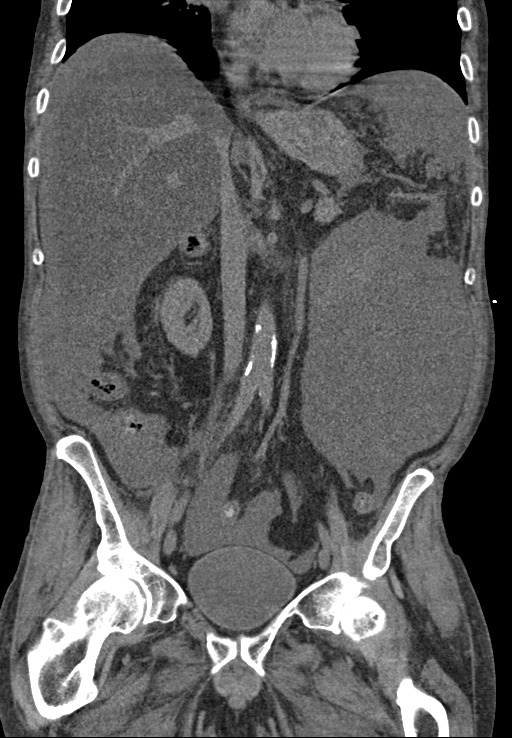

[15 of 46 positions shown; findings below may reference images not displayed]

FINDINGS: Lower chest: There are trace bilateral pleural effusions.The heart
size is normal. The intracardiac blood pool is hypodense relative to
the adjacent myocardium consistent with anemia.

Hepatobiliary: There is severe diffuse hepatic steatosis. There is
cholelithiasis. There may be some gallbladder wall thickening.There
is no biliary ductal dilation.

Pancreas: Normal contours without ductal dilatation. No
peripancreatic fluid collection.

Spleen: Unremarkable.

Adrenals/Urinary Tract:

--Adrenal glands: Unremarkable.

--Right kidney/ureter: No hydronephrosis or radiopaque kidney
stones.

--Left kidney/ureter: No hydronephrosis or radiopaque kidney stones.

--Urinary bladder: Unremarkable.

Stomach/Bowel:

--Stomach/Duodenum: There is a moderate-sized hiatal hernia.

--Small bowel: Unremarkable.

--Colon: Unremarkable.

--Appendix: Normal.

Vascular/Lymphatic: Atherosclerotic calcification is present within
the non-aneurysmal abdominal aorta, without hemodynamically
significant stenosis.

--No retroperitoneal lymphadenopathy.

--No mesenteric lymphadenopathy.

--No pelvic or inguinal lymphadenopathy.

Reproductive: Unremarkable

Other: There is a moderate to large volume of ascites. The abdominal
wall is normal.

Musculoskeletal. No acute displaced fractures.
IMPRESSION: 1. No radiopaque kidney stones.
2. Severe diffuse hepatic steatosis.
3. Cholelithiasis with possible gallbladder wall thickening. If
there is concern for acute cholecystitis, recommend further
evaluation with right upper quadrant ultrasound.
4. Moderate to large volume of ascites.
5. Trace bilateral pleural effusions.

Aortic Atherosclerosis ([id number]-E98.8).

## 2022-02-13 IMAGING — US US PARACENTESIS
1 series · 8 of 8 positions shown · non-contrast
Comparison: none

INDICATION: Patient with a history of alcoholism and decompensated liver
cirrhosis. Patient presented to the ED with altered mental status
and ascites. Interventional radiology asked to perform a diagnostic
and therapeutic paracentesis.

[Series 1: us paracentesis · 8 of 8 slices shown]
[im 1/8]
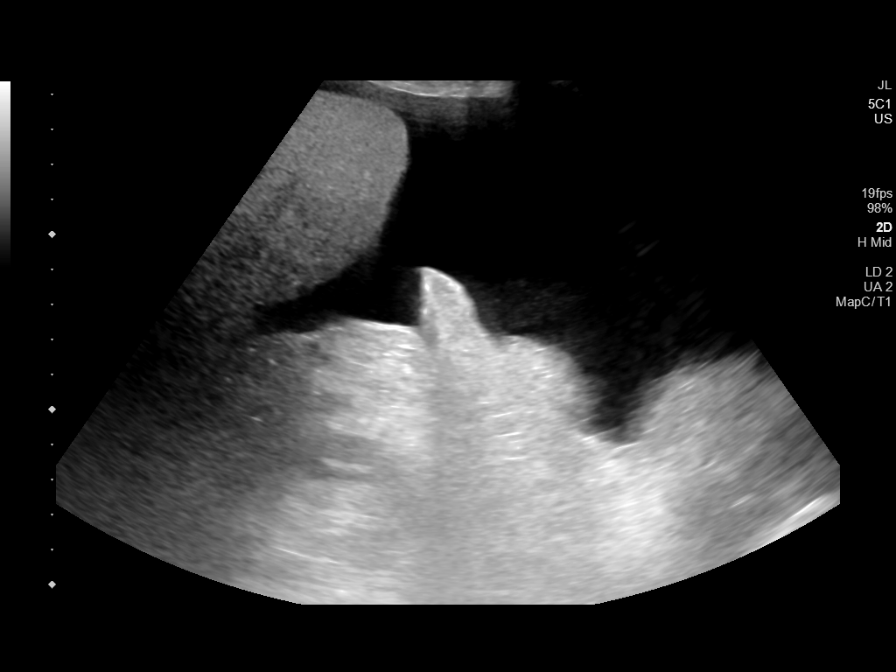
[im 2/8]
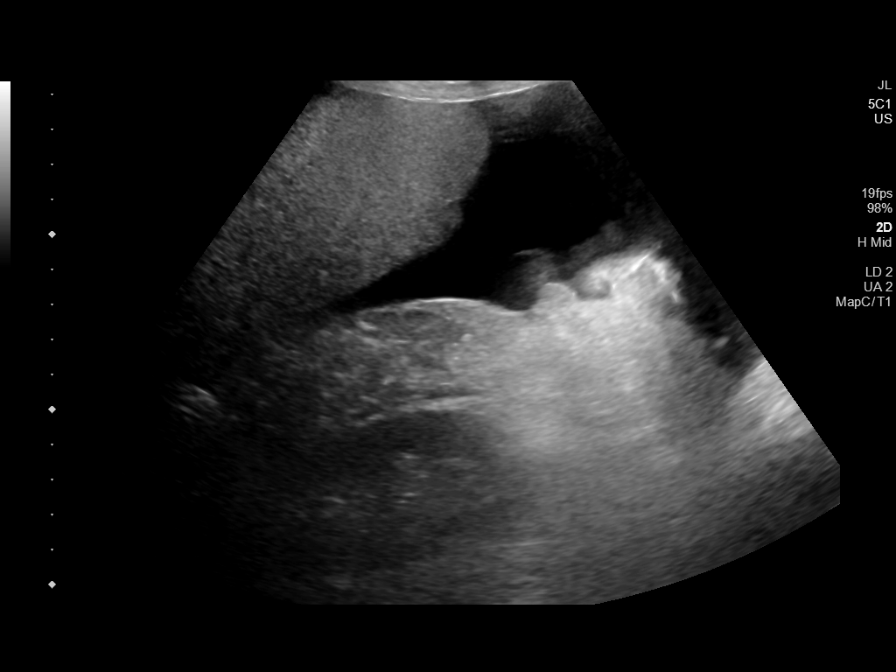
[im 3/8]
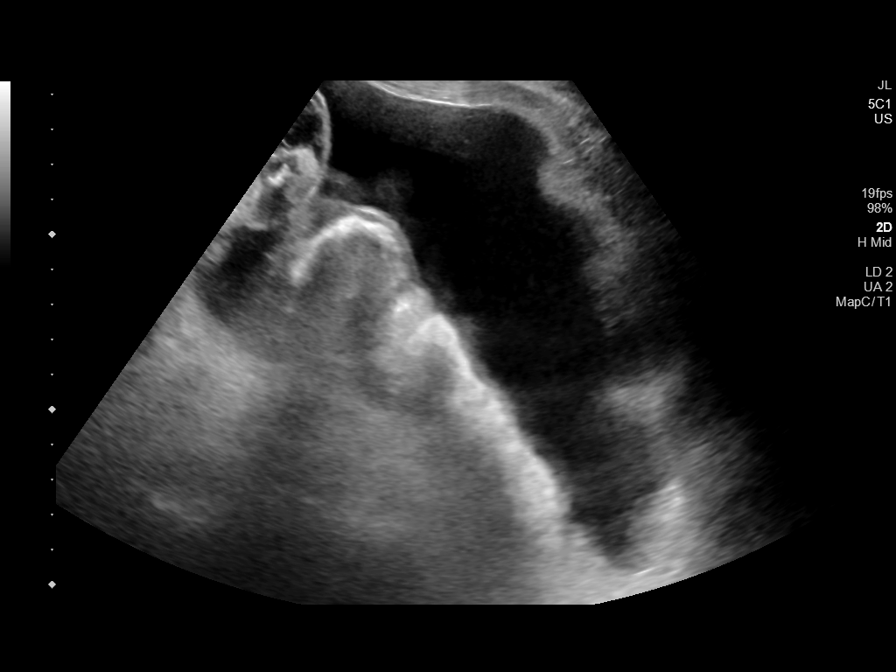
[im 4/8]
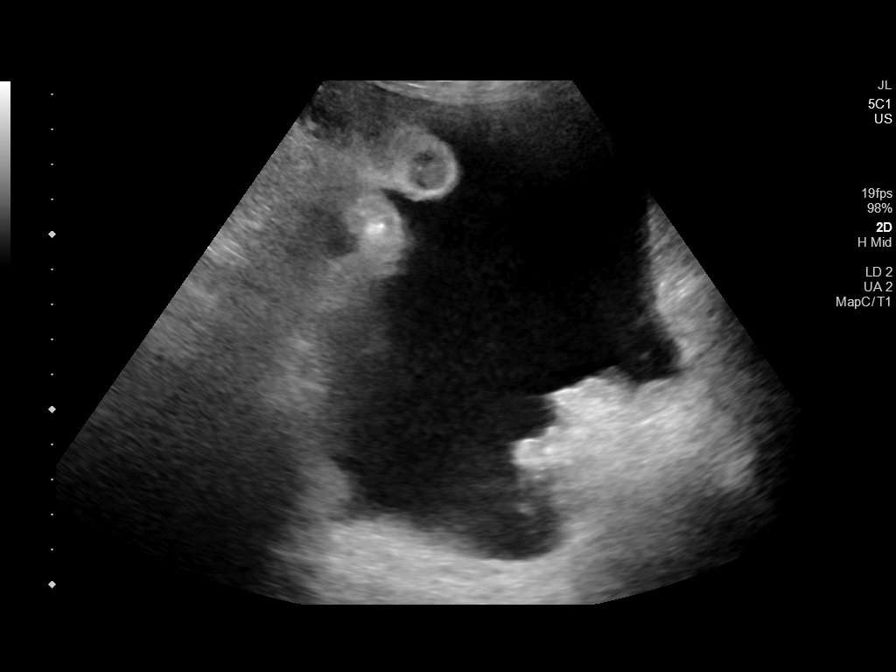
[im 5/8]
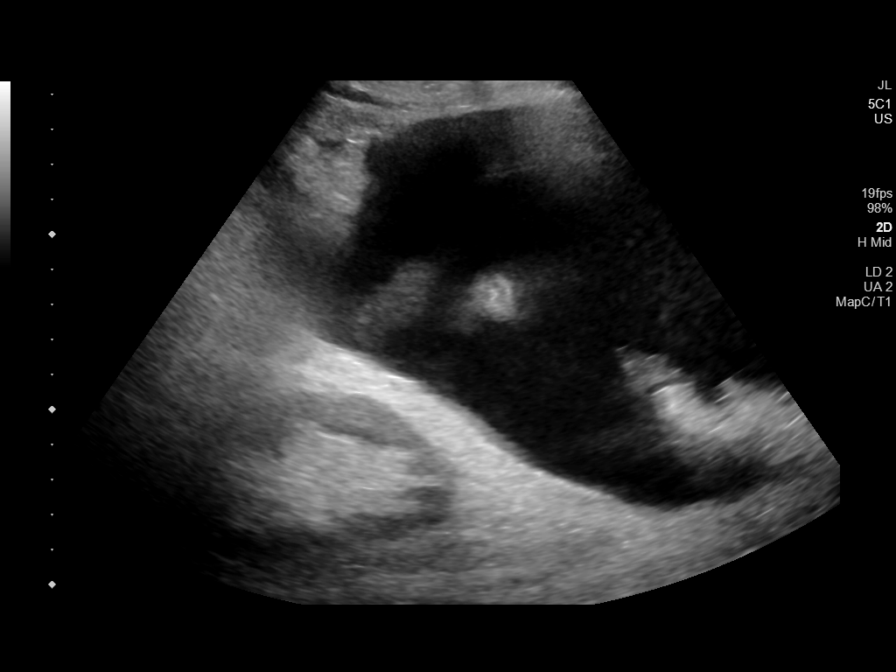
[im 6/8]
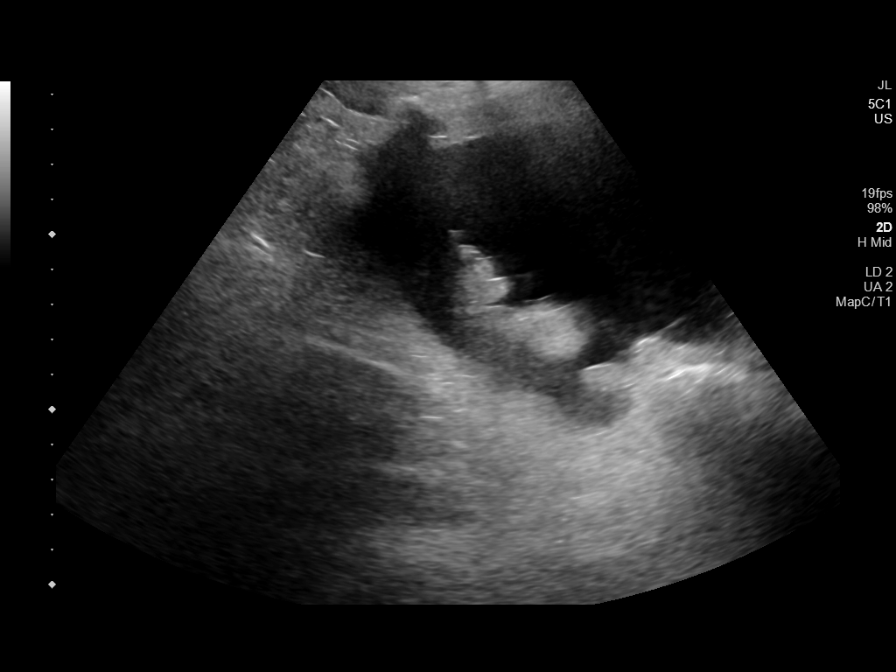
[im 7/8]
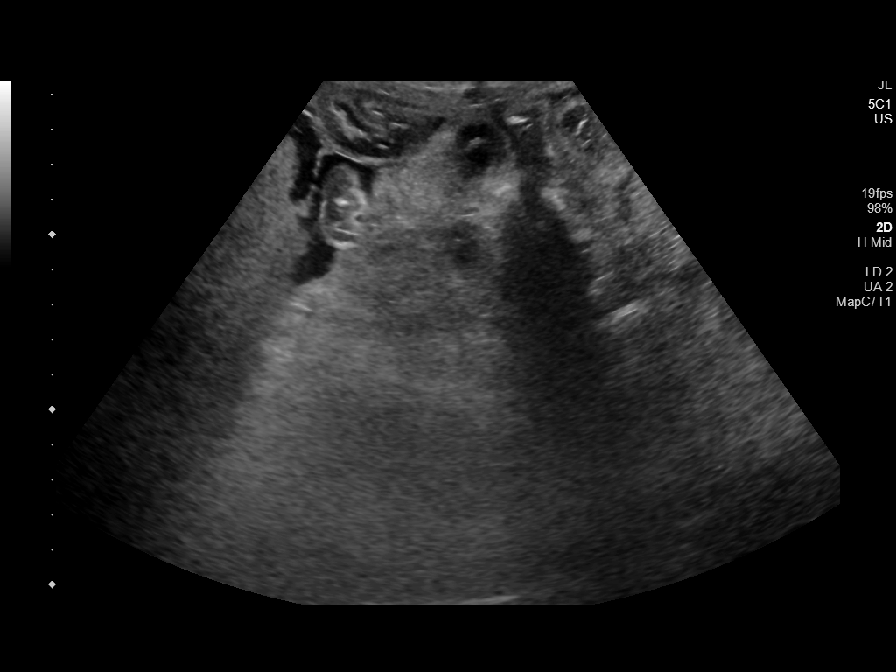
[im 8/8]
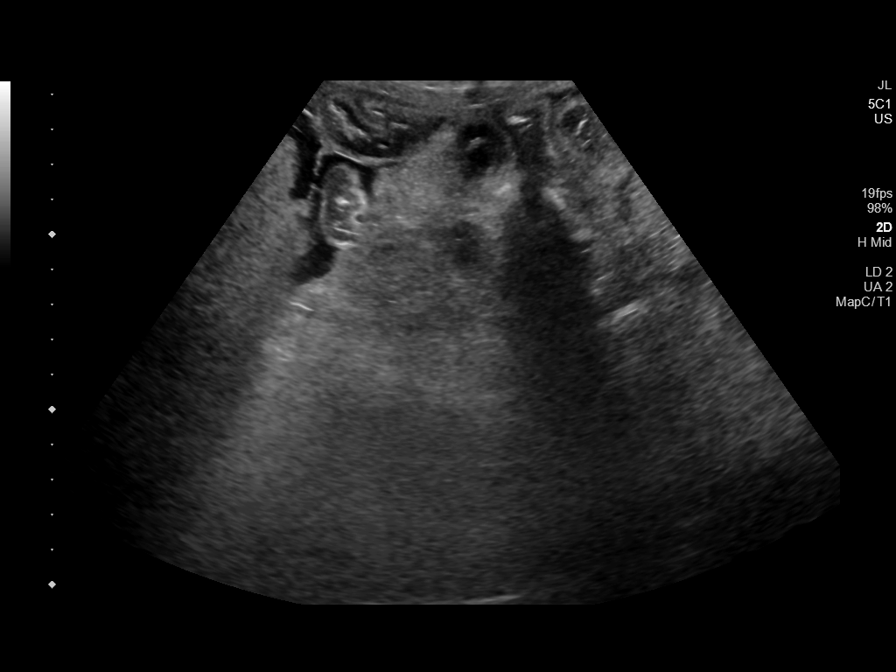

[8 of 8 positions shown; findings below may reference images not displayed]

EXAM:
ULTRASOUND GUIDED right PARACENTESIS

MEDICATIONS:
1% lidocaine 10 mL

COMPLICATIONS:
None immediate

PROCEDURE:
Informed written consent was obtained from the patient after a
discussion of the risks, benefits and alternatives to treatment. A
timeout was performed prior to the initiation of the procedure.

Initial ultrasound scanning demonstrates a large amount of ascites
within the right lower abdominal quadrant. The right lower abdomen
was prepped and draped in the usual sterile fashion. 1% lidocaine
was used for local anesthesia.

Following this, a 19 gauge, 7-cm, Yueh catheter was introduced. An
ultrasound image was saved for documentation purposes. The
paracentesis was performed. The catheter was removed and a dressing
was applied. The patient tolerated the procedure well without
immediate post procedural complication.
Patient received post-procedure intravenous albumin; see nursing
notes for details.
FINDINGS: A total of approximately 5883 mL of clear yellow fluid was removed.
Samples were sent to the laboratory as requested by the clinical
team.
IMPRESSION: Successful ultrasound-guided paracentesis yielding 5883 mL of
peritoneal fluid. Read by: Wero Mincey, NP
